# Patient Record
Sex: Female | Born: 1985 | Race: Black or African American | Hispanic: No | Marital: Single | Smoking: Never smoker
Health system: Southern US, Community
[De-identification: ages and names within clinical notes are randomized; demographics above are authoritative.]

## PROBLEM LIST (undated history)

## (undated) DIAGNOSIS — O139 Gestational [pregnancy-induced] hypertension without significant proteinuria, unspecified trimester: Secondary | ICD-10-CM

## (undated) DIAGNOSIS — D649 Anemia, unspecified: Secondary | ICD-10-CM

## (undated) DIAGNOSIS — Z862 Personal history of diseases of the blood and blood-forming organs and certain disorders involving the immune mechanism: Secondary | ICD-10-CM

## (undated) HISTORY — DX: Personal history of diseases of the blood and blood-forming organs and certain disorders involving the immune mechanism: Z86.2

## (undated) HISTORY — PX: FOOT SURGERY: SHX648

## (undated) HISTORY — DX: Gestational (pregnancy-induced) hypertension without significant proteinuria, unspecified trimester: O13.9

---

## 2009-07-30 ENCOUNTER — Emergency Department (HOSPITAL_COMMUNITY): Admission: EM | Admit: 2009-07-30 | Discharge: 2009-07-30 | Payer: Self-pay | Admitting: Emergency Medicine

## 2010-09-18 ENCOUNTER — Emergency Department (HOSPITAL_COMMUNITY): Admission: EM | Admit: 2010-09-18 | Discharge: 2010-09-18 | Payer: Self-pay | Admitting: Emergency Medicine

## 2011-01-02 ENCOUNTER — Inpatient Hospital Stay (HOSPITAL_COMMUNITY)
Admission: AD | Admit: 2011-01-02 | Discharge: 2011-01-02 | Disposition: A | Discharge status: Home / Self Care | Payer: Medicaid Other | Source: Ambulatory Visit | Attending: Obstetrics & Gynecology | Admitting: Obstetrics & Gynecology

## 2011-01-02 DIAGNOSIS — M549 Dorsalgia, unspecified: Secondary | ICD-10-CM | POA: Insufficient documentation

## 2011-01-02 DIAGNOSIS — N390 Urinary tract infection, site not specified: Secondary | ICD-10-CM | POA: Insufficient documentation

## 2011-01-02 DIAGNOSIS — O2390 Unspecified genitourinary tract infection in pregnancy, unspecified trimester: Secondary | ICD-10-CM | POA: Insufficient documentation

## 2011-01-02 LAB — URINALYSIS, ROUTINE W REFLEX MICROSCOPIC
Hgb urine dipstick: NEGATIVE
Protein, ur: NEGATIVE mg/dL
Urine Glucose, Fasting: NEGATIVE mg/dL

## 2011-01-02 LAB — URINE MICROSCOPIC-ADD ON

## 2011-01-04 LAB — URINE CULTURE: Culture  Setup Time: 201202290133

## 2011-01-16 LAB — URINALYSIS, ROUTINE W REFLEX MICROSCOPIC
Glucose, UA: NEGATIVE mg/dL
Ketones, ur: NEGATIVE mg/dL
Specific Gravity, Urine: 1.027 (ref 1.005–1.030)
pH: 6 (ref 5.0–8.0)

## 2011-01-16 LAB — URINE MICROSCOPIC-ADD ON

## 2011-01-16 LAB — POCT I-STAT, CHEM 8
BUN: 8 mg/dL (ref 6–23)
Calcium, Ion: 1.14 mmol/L (ref 1.12–1.32)
Creatinine, Ser: 0.9 mg/dL (ref 0.4–1.2)
Glucose, Bld: 84 mg/dL (ref 70–99)
Hemoglobin: 11.6 g/dL — ABNORMAL LOW (ref 12.0–15.0)
TCO2: 24 mmol/L (ref 0–100)

## 2011-06-13 ENCOUNTER — Emergency Department (HOSPITAL_COMMUNITY): Payer: Medicaid Other

## 2011-06-13 ENCOUNTER — Emergency Department (HOSPITAL_COMMUNITY)
Admission: EM | Admit: 2011-06-13 | Discharge: 2011-06-13 | Disposition: A | Discharge status: Home / Self Care | Payer: Medicaid Other | Attending: Emergency Medicine | Admitting: Emergency Medicine

## 2011-06-13 DIAGNOSIS — M25569 Pain in unspecified knee: Secondary | ICD-10-CM | POA: Insufficient documentation

## 2011-06-13 DIAGNOSIS — Y92410 Unspecified street and highway as the place of occurrence of the external cause: Secondary | ICD-10-CM | POA: Insufficient documentation

## 2011-06-13 DIAGNOSIS — W010XXA Fall on same level from slipping, tripping and stumbling without subsequent striking against object, initial encounter: Secondary | ICD-10-CM | POA: Insufficient documentation

## 2011-06-13 DIAGNOSIS — S8010XA Contusion of unspecified lower leg, initial encounter: Secondary | ICD-10-CM | POA: Insufficient documentation

## 2013-01-19 ENCOUNTER — Encounter (HOSPITAL_COMMUNITY): Payer: Self-pay | Admitting: Emergency Medicine

## 2013-01-19 ENCOUNTER — Emergency Department (HOSPITAL_COMMUNITY)
Admission: EM | Admit: 2013-01-19 | Discharge: 2013-01-19 | Disposition: A | Discharge status: Home / Self Care | Payer: No Typology Code available for payment source | Attending: Emergency Medicine | Admitting: Emergency Medicine

## 2013-01-19 DIAGNOSIS — T148XXA Other injury of unspecified body region, initial encounter: Secondary | ICD-10-CM | POA: Insufficient documentation

## 2013-01-19 DIAGNOSIS — S46909A Unspecified injury of unspecified muscle, fascia and tendon at shoulder and upper arm level, unspecified arm, initial encounter: Secondary | ICD-10-CM | POA: Insufficient documentation

## 2013-01-19 DIAGNOSIS — Y9389 Activity, other specified: Secondary | ICD-10-CM | POA: Insufficient documentation

## 2013-01-19 DIAGNOSIS — Y92410 Unspecified street and highway as the place of occurrence of the external cause: Secondary | ICD-10-CM | POA: Insufficient documentation

## 2013-01-19 DIAGNOSIS — S4980XA Other specified injuries of shoulder and upper arm, unspecified arm, initial encounter: Secondary | ICD-10-CM | POA: Insufficient documentation

## 2013-01-19 DIAGNOSIS — IMO0002 Reserved for concepts with insufficient information to code with codable children: Secondary | ICD-10-CM | POA: Insufficient documentation

## 2013-01-19 DIAGNOSIS — G44309 Post-traumatic headache, unspecified, not intractable: Secondary | ICD-10-CM | POA: Insufficient documentation

## 2013-01-19 MED ORDER — IBUPROFEN 800 MG PO TABS: 800.0000 mg | ORAL_TABLET | Freq: Three times a day (TID) | ORAL | Status: DC

## 2013-01-19 MED ORDER — IBUPROFEN 800 MG PO TABS
800.0000 mg | ORAL_TABLET | Freq: Once | ORAL | Status: AC
  Administered 2013-01-19: 800 mg via ORAL
  Filled 2013-01-19: qty 1

## 2013-01-19 MED ORDER — HYDROCODONE-ACETAMINOPHEN 5-325 MG PO TABS: 1.0000 | ORAL_TABLET | ORAL | Status: DC | PRN

## 2013-01-19 MED ORDER — CYCLOBENZAPRINE HCL 10 MG PO TABS: 10.0000 mg | ORAL_TABLET | Freq: Two times a day (BID) | ORAL | Status: DC | PRN
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
## 2013-01-22 NOTE — ED Provider Notes (Signed)
Medical screening examination/treatment/procedure(s) were performed by non-physician practitioner and as supervising physician I was immediately available for consultation/collaboration.  Gilda Crease, MD 01/22/13 1258

## 2013-08-04 ENCOUNTER — Encounter (HOSPITAL_COMMUNITY): Payer: Self-pay

## 2013-08-04 ENCOUNTER — Emergency Department (HOSPITAL_COMMUNITY)
Admission: EM | Admit: 2013-08-04 | Discharge: 2013-08-05 | Disposition: A | Discharge status: Home / Self Care | Payer: No Typology Code available for payment source | Attending: Emergency Medicine | Admitting: Emergency Medicine

## 2013-08-04 DIAGNOSIS — W010XXA Fall on same level from slipping, tripping and stumbling without subsequent striking against object, initial encounter: Secondary | ICD-10-CM | POA: Insufficient documentation

## 2013-08-04 DIAGNOSIS — Y9289 Other specified places as the place of occurrence of the external cause: Secondary | ICD-10-CM | POA: Insufficient documentation

## 2013-08-04 DIAGNOSIS — Y9302 Activity, running: Secondary | ICD-10-CM | POA: Insufficient documentation

## 2013-08-04 DIAGNOSIS — S8991XA Unspecified injury of right lower leg, initial encounter: Secondary | ICD-10-CM

## 2013-08-04 DIAGNOSIS — S8990XA Unspecified injury of unspecified lower leg, initial encounter: Secondary | ICD-10-CM | POA: Insufficient documentation
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
## 2013-08-05 ENCOUNTER — Emergency Department (HOSPITAL_COMMUNITY): Payer: Medicaid Other

## 2013-08-05 MED ORDER — IBUPROFEN 800 MG PO TABS
ORAL_TABLET | ORAL | Status: AC
  Administered 2013-08-05: 800 mg via ORAL
  Filled 2013-08-05: qty 1

## 2013-08-05 MED ORDER — IBUPROFEN 800 MG PO TABS
800.0000 mg | ORAL_TABLET | Freq: Once | ORAL | Status: AC
  Administered 2013-08-05: 800 mg via ORAL

## 2013-08-05 MED ORDER — HYDROCODONE-ACETAMINOPHEN 5-325 MG PO TABS: 1.0000 | ORAL_TABLET | Freq: Four times a day (QID) | ORAL | Status: DC | PRN

## 2013-08-05 NOTE — ED Provider Notes (Signed)
Medical screening examination/treatment/procedure(s) were performed by non-physician practitioner and as supervising physician I was immediately available for consultation/collaboration.  Akshaj Besancon, MD 08/05/13 0622 
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
## 2017-08-04 ENCOUNTER — Emergency Department (HOSPITAL_COMMUNITY)
Admission: EM | Admit: 2017-08-04 | Discharge: 2017-08-04 | Disposition: A | Discharge status: Home / Self Care | Payer: Self-pay | Attending: Emergency Medicine | Admitting: Emergency Medicine

## 2017-08-04 ENCOUNTER — Emergency Department (HOSPITAL_COMMUNITY): Payer: Self-pay

## 2017-08-04 ENCOUNTER — Encounter (HOSPITAL_COMMUNITY): Payer: Self-pay

## 2017-08-04 DIAGNOSIS — S161XXA Strain of muscle, fascia and tendon at neck level, initial encounter: Secondary | ICD-10-CM | POA: Insufficient documentation

## 2017-08-04 DIAGNOSIS — Y999 Unspecified external cause status: Secondary | ICD-10-CM | POA: Insufficient documentation

## 2017-08-04 DIAGNOSIS — S20212A Contusion of left front wall of thorax, initial encounter: Secondary | ICD-10-CM | POA: Insufficient documentation

## 2017-08-04 DIAGNOSIS — Y939 Activity, unspecified: Secondary | ICD-10-CM | POA: Insufficient documentation

## 2017-08-04 DIAGNOSIS — Y929 Unspecified place or not applicable: Secondary | ICD-10-CM | POA: Insufficient documentation

## 2017-08-04 MED ORDER — CYCLOBENZAPRINE HCL 10 MG PO TABS
10.0000 mg | ORAL_TABLET | Freq: Once | ORAL | Status: AC
  Administered 2017-08-04: 10 mg via ORAL
  Filled 2017-08-04: qty 1

## 2017-08-04 MED ORDER — DICLOFENAC SODIUM 50 MG PO TBEC: 50.0000 mg | DELAYED_RELEASE_TABLET | Freq: Two times a day (BID) | ORAL | 0 refills | Status: DC

## 2017-08-04 MED ORDER — CYCLOBENZAPRINE HCL 10 MG PO TABS: 10.0000 mg | ORAL_TABLET | Freq: Two times a day (BID) | ORAL | 0 refills | Status: DC | PRN

## 2017-08-04 MED ORDER — HYDROCODONE-ACETAMINOPHEN 5-325 MG PO TABS
1.0000 | ORAL_TABLET | Freq: Once | ORAL | Status: AC
  Administered 2017-08-04: 1 via ORAL
  Filled 2017-08-04: qty 1
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
## 2017-08-04 NOTE — Discharge Instructions (Signed)
Do not drive while taking the muscle relaxer as it will make you sleepy. Follow up with your doctor. Return here for worsening symptoms
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
## 2018-12-25 ENCOUNTER — Encounter (HOSPITAL_COMMUNITY): Payer: Self-pay

## 2018-12-25 ENCOUNTER — Emergency Department (HOSPITAL_COMMUNITY)
Admission: EM | Admit: 2018-12-25 | Discharge: 2018-12-25 | Disposition: A | Discharge status: Home / Self Care | Payer: Self-pay | Attending: Emergency Medicine | Admitting: Emergency Medicine

## 2018-12-25 ENCOUNTER — Other Ambulatory Visit: Payer: Self-pay

## 2018-12-25 DIAGNOSIS — Z79899 Other long term (current) drug therapy: Secondary | ICD-10-CM | POA: Insufficient documentation

## 2018-12-25 DIAGNOSIS — K0889 Other specified disorders of teeth and supporting structures: Secondary | ICD-10-CM | POA: Insufficient documentation

## 2018-12-25 MED ORDER — PENICILLIN V POTASSIUM 500 MG PO TABS: 500.0000 mg | ORAL_TABLET | Freq: Four times a day (QID) | ORAL | 0 refills | Status: AC

## 2018-12-25 MED FILL — PENICILLIN VK 500 MG TABLET: 500 | 10 days supply | Qty: 40 | Fill #0

## 2018-12-25 NOTE — ED Triage Notes (Signed)
Pt endorses left upper dental pain after having the back upper left tooth chip while eating "a while ago" No pain until 2 days ago. VSS

## 2018-12-25 NOTE — Discharge Instructions (Signed)
Please read attached information. If you experience any new or worsening signs or symptoms please return to the emergency room for evaluation. Please follow-up with your primary care provider or specialist as discussed. Please use medication prescribed only as directed and discontinue taking if you have any concerning signs or symptoms.   °

## 2018-12-25 NOTE — ED Provider Notes (Signed)
[facility] EMERGENCY DEPARTMENT Provider Note   CSN: [id number] Arrival date & time: 12/25/18  3762   History   Chief Complaint Chief Complaint  Patient presents with  . Dental Pain    HPI Lydia Mendoza is a 33 y.o. female.    HPI    33 year old female presents today with complaints of dental pain.  Patient notes ongoing history of worsening left upper third molar decay.  She notes pieces have been chipping away.  She notes 2 days ago she developed pain to this area with some bleeding from the surrounding gums.  She denies any significant swelling, fever, patient swelling.  Patient does not have a dentist.  No medications have been used for pain.   History reviewed. No pertinent past medical history.  There are no active problems to display for this patient.   Past Surgical History:  Procedure Laterality Date  . FOOT SURGERY       OB History   No obstetric history on file.      Home Medications    Prior to Admission medications   Medication Sig Start Date End Date Taking? Authorizing Provider  cyclobenzaprine (FLEXERIL) 10 MG tablet Take 1 tablet (10 mg total) by mouth 2 (two) times daily as needed for muscle spasms. 08/04/17   Ashley Murrain, NP  diclofenac (VOLTAREN) 50 MG EC tablet Take 1 tablet (50 mg total) by mouth 2 (two) times daily. 08/04/17   Ashley Murrain, NP  HYDROcodone-acetaminophen (NORCO/VICODIN) 5-325 MG per tablet Take 1 tablet by mouth every 6 (six) hours as needed for pain. 08/05/13   Lawyer, Harrell Gave, PA-C  penicillin v potassium (VEETID) 500 MG tablet Take 1 tablet (500 mg total) by mouth 4 (four) times daily for 10 days. 12/25/18 01/04/19  Okey Regal, PA-C    Family History History reviewed. No pertinent family history.  Social History Social History   Tobacco Use  . Smoking status: Never Smoker  Substance Use Topics  . Alcohol use: No  . Drug use: No     Allergies   Patient has no known allergies.   Review  of Systems Review of Systems  All other systems reviewed and are negative.   Physical Exam Updated Vital Signs BP 134/85 (BP Location: Right Arm)   Pulse 70   Temp 98 F (36.7 C) (Oral)   Resp 18   LMP 11/27/2018 (Exact Date)   SpO2 99%   Physical Exam Vitals signs and nursing note reviewed.  Constitutional:      Appearance: She is well-developed.  HENT:     Head: Normocephalic and atraumatic.     Comments: Left third molar with decay down to the gumline, no surrounding erythema bleeding or exudate-gumline palpated with no swelling or masses Eyes:     General: No scleral icterus.       Right eye: No discharge.        Left eye: No discharge.     Conjunctiva/sclera: Conjunctivae normal.     Pupils: Pupils are equal, round, and reactive to light.  Neck:     Musculoskeletal: Normal range of motion.     Vascular: No JVD.     Trachea: No tracheal deviation.  Pulmonary:     Effort: Pulmonary effort is normal.     Breath sounds: No stridor.  Neurological:     Mental Status: She is alert and oriented to person, place, and time.     Coordination: Coordination normal.  Psychiatric:  Behavior: Behavior normal.        Thought Content: Thought content normal.        Judgment: Judgment normal.      ED Treatments / Results  Labs (all labs ordered are listed, but only abnormal results are displayed) Labs Reviewed - No data to display  EKG None  Radiology No results found.  Procedures Procedures (including critical care time)  Medications Ordered in ED Medications - No data to display   Initial Impression / Assessment and Plan / ED Course  I have reviewed the triage vital signs and the nursing notes.  Pertinent labs & imaging results that were available during my care of the patient were reviewed by me and considered in my medical decision making (see chart for details).        33 year old female presents today with dental pain.  She has no signs of  drainable abscess.  She notes some bleeding, question early infection.  Patient be placed on antibiotics encouraged use Tylenol ibuprofen discharged with outpatient follow-up and return precautions.  She verbalized understanding and agreement to today's plan.  Final Clinical Impressions(s) / ED Diagnoses   Final diagnoses:  Pain, dental    ED Discharge Orders         Ordered    penicillin v potassium (VEETID) 500 MG tablet  4 times daily     12/25/18 1155           Okey Regal, Hershal Coria 12/25/18 1618    Jola Schmidt, MD 12/25/18 [phone]

## 2019-02-04 ENCOUNTER — Ambulatory Visit: Payer: Self-pay | Admitting: Family Medicine

## 2019-02-25 ENCOUNTER — Ambulatory Visit: Payer: Self-pay | Admitting: Family Medicine

## 2019-03-11 ENCOUNTER — Ambulatory Visit: Payer: Self-pay | Attending: Family Medicine | Admitting: Family Medicine

## 2019-03-11 ENCOUNTER — Other Ambulatory Visit: Payer: Self-pay

## 2019-03-11 ENCOUNTER — Encounter: Payer: Self-pay | Admitting: Family Medicine

## 2019-03-11 DIAGNOSIS — J301 Allergic rhinitis due to pollen: Secondary | ICD-10-CM | POA: Insufficient documentation

## 2019-03-11 MED ORDER — CETIRIZINE HCL 10 MG PO TABS: 10.0000 mg | ORAL_TABLET | Freq: Every day | ORAL | 3 refills | Status: DC

## 2019-03-11 NOTE — Progress Notes (Signed)
Per pt this visit is to est care and that's it. Perpt she is feeling fine and do not need refills.

## 2019-03-11 NOTE — Progress Notes (Signed)
Virtual Visit via Telephone Note  I connected with De Nurse on 03/11/19 at  3:30 PM EDT by telephone and verified that I am speaking with the correct person using two identifiers.   I discussed the limitations, risks, security and privacy concerns of performing an evaluation and management service by telephone and the availability of in person appointments. I also discussed with the patient that there may be a patient responsible charge related to this service. The patient expressed understanding and agreed to proceed.  Patient Location: Home Provider Location: Office Others participating in call: Emilio Aspen, RMA   History of Present Illness:      33 year old female new to the practice.  Patient is status post emergency department visit on December 25, 2018 secondary to dental pain.  Patient reports that since that time she has been able to follow-up with a dentist and is now having no issues with dental pain.  Patient reports that she currently takes Zyrtec for sneezing, nasal congestion related to seasonal allergies which are triggered by pollen.  Patient generally needs to take medication from spring through the end of the summer.  Patient states that she is otherwise healthy.  She has a past history of anemia during pregnancy.  She has had no recent follow-up of anemia but denies any issues with fatigue.  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  Patient has family history significant for maternal grandmother with breast cancer and mother with hypertension and depression.  No family history of heart disease or diabetes.  Patient has never smoked.  Patient reports that she was working for [employer] in [employer] but was recently laid off due to the COVID-19 pandemic.  Patient has no significant concerns or issues but wanted to  establish care.  Patient would like to schedule for a well exam later in the year.  Past Medical History:  Diagnosis Date  . History of anemia     Past Surgical History:  Procedure Laterality Date  . FOOT SURGERY Left    age 53 due to congenital deformity    Family History  Problem Relation Age of Onset  . Depression Mother   . Breast cancer Maternal Grandmother   . Hypertension Maternal Grandmother   . Heart disease Neg Hx     Social History   Tobacco Use  . Smoking status: Never Smoker  . Smokeless tobacco: Never Used  Substance Use Topics  . Alcohol use: No  . Drug use: No     No Known Allergies   Review of Systems  Constitutional: Negative for chills, fever and malaise/fatigue.  HENT: Positive for congestion. Negative for sore throat.        Sneezing  Eyes: Negative for blurred vision and double vision.  Respiratory: Negative for cough and shortness of breath.   Cardiovascular: Negative for chest pain and palpitations.  Gastrointestinal: Negative for abdominal pain, constipation, diarrhea, nausea and vomiting.  Genitourinary: Negative for dysuria and frequency.  Musculoskeletal: Negative for back pain, joint pain and myalgias.  Skin: Negative for itching and rash.  Neurological: Negative for dizziness and headaches.  Endo/Heme/Allergies: Negative for polydipsia. Does not bruise/bleed easily.     Observations/Objective: No vital signs or physical exam conducted as visit was done via telephone due to current restrictions/limitations on-office visits secondary to recurrent COVID-19 pandemic  Assessment and Plan: 1. Seasonal allergic rhinitis due to pollen Patient with seasonal allergic rhinitis due to pollen.  She currently takes over-the-counter Zyrtec.  Prescription is sent to the pharmacy at this office which may be cheaper for the patient.  Patient is otherwise well and will schedule preventive/well female exam later in the year.  - cetirizine (ZYRTEC) 10 MG  tablet; Take 1 tablet (10 mg total) by mouth at bedtime.  Dispense: 90 tablet; Refill: 3 - Ambulatory referral to Social Work  Follow Up Instructions:Return if symptoms worsen or fail to improve, for well female exam in summer or fall.    I discussed the assessment and treatment plan with the patient. The patient was provided an opportunity to ask questions and all were answered. The patient agreed with the plan and demonstrated an understanding of the instructions.   The patient was advised to call back or seek an in-person evaluation if the symptoms worsen or if the condition fails to improve as anticipated.  I provided 8 minutes of non-face-to-face time during this encounter.   Antony Blackbird, MD

## 2019-05-18 ENCOUNTER — Telehealth: Payer: Self-pay | Admitting: Licensed Clinical Social Worker

## 2019-05-18 NOTE — Telephone Encounter (Signed)
Call placed to patient. LCSW introduced self and explained role at [facility]. Pt shared that she is currently uninsured.   LCSW informed pt of the role of Financial Counselors at [facility] and the application process. Pt agreed to allow LCSW to mail application to address on file. No additional concerns noted.

## 2019-11-06 NOTE — L&D Delivery Note (Signed)
Delivery Note   Patient Name: Lydia Mendoza DOB: [birth year] MRN: [id number]  Date of admission: 10/21/2020 Delivering MD: Noralyn Pick  Date of delivery: 10/22/20 Type of delivery: SVD  Newborn Data: Live born female  Birth Weight:   APGAR: 44, 35   Newborn Delivery   Birth date/time: 10/22/2020 20:48:00 Delivery type: Vaginal, Spontaneous     De Nurse, 34 y.o., @ [redacted]w[redacted]d,  [id number], who was admitted for presenting on 12/17 @ 0000 for IOL for preeclampsia w/o SF.Denies HA, Visual disturbances or epigastric pain.No meds for PreE and BP 148/93.  I was called to the room when she progressed 7cm and have decel, HR to nadir of 80s, remained in 80-90s for 5 mins, FSE placed after reviewing R/V/A, bolus had been given, pitocin turned off, terbutaline given, rapid decent and dilation noted, pt become complete with HR in 60s, urge to push noted, Dr Landry Mellow called and notified, DR Elonda Husky in room assessing. Pt  Then was encouraged to push at 1+ station in the second stage of labor.  She pushed for 1.5/min.  She delivered a viable infant, cephalic and restituted to the ROA position over an intact perineum.  A double nuchal cord   was identified. The baby was placed on maternal abdomen while initial step of NRP were perfmored (Dry, Stimulated, and warmed). Hat placed on baby for thermoregulation. Delayed cord clamping was performed for 1.5 minutes.  Cord double clamped and cut.  Cord cut by fasther. Apgar scores were 8 and 9. Prophylactic Pitocin was started in the third stage of labor for active management. The placenta delivered spontaneously, shultz, with a 3 vessel cord and was sent to LD.  Inspection revealed none. An examination of the vaginal vault and cervix was free from lacerations. The uterus was firm, bleeding stable.   Umbilical venous blood gas was sent.  There were no complications during the procedure.  Mom and baby skin to skin following delivery. Left in stable condition.  Venous blood gad  drawn: Results Pending    Maternal Info: Anesthesia: None Episiotomy: No Lacerations:  No Suture Repair: No Est. Blood Loss (mL):  511 mls  Newborn Info:  Baby Sex: female Circumcision: in pt desired  APGAR (1 MIN): 8   APGAR (5 MINS): 9   APGAR (10 MINS):     Mom to postpartum.  Baby to Couplet care / Skin to Skin.  DR Landry Mellow updated   [place], [place], NP-C 10/22/20 9:19 PM

## 2020-03-16 ENCOUNTER — Encounter: Payer: Self-pay | Admitting: Internal Medicine

## 2020-03-16 ENCOUNTER — Telehealth (INDEPENDENT_AMBULATORY_CARE_PROVIDER_SITE_OTHER): Payer: Medicaid Other | Admitting: Internal Medicine

## 2020-03-16 ENCOUNTER — Other Ambulatory Visit: Payer: Self-pay

## 2020-03-16 DIAGNOSIS — Z3A01 Less than 8 weeks gestation of pregnancy: Secondary | ICD-10-CM | POA: Diagnosis not present

## 2020-03-16 DIAGNOSIS — Z7689 Persons encountering health services in other specified circumstances: Secondary | ICD-10-CM

## 2020-03-16 DIAGNOSIS — L910 Hypertrophic scar: Secondary | ICD-10-CM

## 2020-03-16 MED ORDER — PRENATAL 28-0.8 MG PO TABS: 1.0000 | ORAL_TABLET | Freq: Every day | ORAL | 11 refills | Status: DC

## 2020-03-16 NOTE — Progress Notes (Signed)
Virtual Visit via Telephone Note  I connected with Lydia Mendoza, on 03/16/2020 at 10:04 AM by telephone due to the COVID-19 pandemic and verified that I am speaking with the correct person using two identifiers.   Consent: I discussed the limitations, risks, security and privacy concerns of performing an evaluation and management service by telephone and the availability of in person appointments. I also discussed with the patient that there may be a patient responsible charge related to this service. The patient expressed understanding and agreed to proceed.   Location of Patient: Home   Location of Provider: Clinic    Persons participating in Telemedicine visit: [facility] Dr. Juleen China      History of Present Illness: Patient has a visit to establish care. No PMH. Never taken any type of medications. She had surgery on her left foot as a young child for reconstructive surgery for birth defect.   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 Have been present over 10 years. The one behind her ear and on her arm have become painful. One behind her ear is getting larger.   Patient reports she took HPT and it was positive. Her LMP was 4/2. Has history of twin gestation. Three living children. History of one TAB. No history of GDM. GHTN, or Pre-E. She is not currently taking a prenatal vitamin. She has set up care with Ob provider.    Past Medical History:  Diagnosis Date  . History of anemia    Allergies  Allergen Reactions  . Shellfish Allergy Hives and Itching    No current outpatient medications on file prior to visit.   No current facility-administered medications on file prior to visit.    Observations/Objective: NAD. Speaking clearly.  Work of breathing normal.  Alert and oriented. Mood appropriate.   Assessment and Plan: 1. Encounter to establish care Reviewed patient's PMH, social  history, surgical history, and medications.  Is overdue for annual exam, screening blood work, and health maintenance topics. Have asked patient to ensure OB does PAP.   2. Keloid - Ambulatory referral to Dermatology  3. [redacted] weeks gestation of pregnancy Already has visit for initial prenatal care. Discussed use of MAU during pregnancy and reasons to seek emergency care during her pregnancy. PNV sent to her pharmacy. She is not currently taking any other medications.  - Prenatal 28-0.8 MG TABS; Take 1 tablet by mouth daily.  Dispense: 30 tablet; Refill: 11    Follow Up Instructions: OB to establish care    I discussed the assessment and treatment plan with the patient. The patient was provided an opportunity to ask questions and all were answered. The patient agreed with the plan and demonstrated an understanding of the instructions.   The patient was advised to call back or seek an in-person evaluation if the symptoms worsen or if the condition fails to improve as anticipated.     I provided 16 minutes total of non-face-to-face time during this encounter including median intraservice time, reviewing previous notes, investigations, ordering medications, medical decision making, coordinating care and patient verbalized understanding at the end of the visit.    Phill Myron, D.O. Primary Care at [facility]  03/16/2020, 10:04 AM

## 2020-03-21 ENCOUNTER — Telehealth: Payer: Self-pay | Admitting: General Practice

## 2020-03-21 MED ORDER — PRENATAL VITAMIN 27-0.8 MG PO TABS: 1.0000 | ORAL_TABLET | Freq: Every day | ORAL | 2 refills | Status: AC

## 2020-03-21 NOTE — Telephone Encounter (Signed)
[place] (SE), [place] - [address]  [phone] [address], [place] (SE) [facility] doesn't have this brand need a new medication Prenatal 28-0.8 MG TABS [id number]

## 2020-03-21 NOTE — Telephone Encounter (Signed)
Alternative Rx sent

## 2020-04-25 LAB — OB RESULTS CONSOLE ABO/RH: RH Type: POSITIVE

## 2020-04-25 LAB — OB RESULTS CONSOLE HEPATITIS B SURFACE ANTIGEN: Hepatitis B Surface Ag: NEGATIVE

## 2020-04-25 LAB — OB RESULTS CONSOLE RUBELLA ANTIBODY, IGM: Rubella: IMMUNE

## 2020-04-25 LAB — OB RESULTS CONSOLE HIV ANTIBODY (ROUTINE TESTING): HIV: NONREACTIVE

## 2020-04-28 LAB — OB RESULTS CONSOLE GC/CHLAMYDIA
Chlamydia: NEGATIVE
Gonorrhea: NEGATIVE

## 2020-06-16 ENCOUNTER — Other Ambulatory Visit: Payer: Self-pay

## 2020-06-16 ENCOUNTER — Emergency Department (HOSPITAL_COMMUNITY)
Admission: EM | Admit: 2020-06-16 | Discharge: 2020-06-16 | Disposition: A | Discharge status: Home / Self Care | Payer: Medicaid Other | Attending: Emergency Medicine | Admitting: Emergency Medicine

## 2020-06-16 ENCOUNTER — Encounter (HOSPITAL_COMMUNITY): Payer: Self-pay | Admitting: Obstetrics and Gynecology

## 2020-06-16 DIAGNOSIS — Z20822 Contact with and (suspected) exposure to covid-19: Secondary | ICD-10-CM | POA: Diagnosis present

## 2020-06-16 DIAGNOSIS — O26892 Other specified pregnancy related conditions, second trimester: Secondary | ICD-10-CM | POA: Insufficient documentation

## 2020-06-16 DIAGNOSIS — Z3A20 20 weeks gestation of pregnancy: Secondary | ICD-10-CM | POA: Insufficient documentation

## 2020-06-16 DIAGNOSIS — M545 Low back pain: Secondary | ICD-10-CM | POA: Insufficient documentation

## 2020-06-16 LAB — SARS CORONAVIRUS 2 BY RT PCR (HOSPITAL ORDER, PERFORMED IN ~~LOC~~ HOSPITAL LAB): SARS Coronavirus 2: NEGATIVE

## 2020-06-16 NOTE — ED Provider Notes (Signed)
[facility] DEPT Provider Note   CSN: [id number] Arrival date & time: 06/16/20  1858     History Chief Complaint  Patient presents with  . Covid Exposure    Lydia Mendoza is a 34 y.o. female.  Patient is a 34 year old female who presents after Covid exposure.  She is 20 months pregnant.  She is fully vaccinated with her last vaccination in June for Covid.  Her husband recently tested positive for Covid within the last 2 days.  She does not currently have any symptoms.  No fevers.  No cough or cold symptoms.  She does not have any pregnancy related complaints.  She has some low back pain which she says feels like it is in her muscles.  It is worse when she moves around.  Its been going on for about 2 weeks.  No abdominal pain.  No cramping.  No vaginal bleeding or discharge.  No urinary symptoms.  She is here for the Covid exposure with the rest of her family.  She is currently undergoing normal prenatal care and has an OB/GYN here in [place].        Past Medical History:  Diagnosis Date  . History of anemia     Patient Active Problem List   Diagnosis Date Noted  . Seasonal allergic rhinitis due to pollen 03/11/2019    Past Surgical History:  Procedure Laterality Date  . FOOT SURGERY Left    age 14 due to congenital deformity     OB History    Gravida  1   Para      Term      Preterm      AB      Living        SAB      TAB      Ectopic      Multiple      Live Births              Family History  Problem Relation Age of Onset  . Depression Mother   . Breast cancer Maternal Grandmother   . Hypertension Maternal Grandmother   . Heart disease Neg Hx     Social History   Tobacco Use  . Smoking status: Never Smoker  . Smokeless tobacco: Never Used  Vaping Use  . Vaping Use: Never used  Substance Use Topics  . Alcohol use: No  . Drug use: No    Home Medications Prior to Admission medications     Medication Sig Start Date End Date Taking? Authorizing Provider  Prenatal Vit-Fe Fumarate-FA (PRENATAL VITAMIN) 27-0.8 MG TABS Take 1 tablet by mouth daily. 03/21/20   Nicolette Bang, DO    Allergies    Shellfish allergy  Review of Systems   Review of Systems  Constitutional: Negative for chills, diaphoresis, fatigue and fever.  HENT: Negative for congestion, rhinorrhea and sneezing.   Eyes: Negative.   Respiratory: Negative for cough, chest tightness and shortness of breath.   Cardiovascular: Negative for chest pain and leg swelling.  Gastrointestinal: Negative for abdominal pain, blood in stool, diarrhea, nausea and vomiting.  Genitourinary: Negative for difficulty urinating, flank pain, frequency and hematuria.  Musculoskeletal: Positive for back pain. Negative for arthralgias.  Skin: Negative for rash.  Neurological: Negative for dizziness, speech difficulty, weakness, numbness and headaches.    Physical Exam Updated Vital Signs BP 122/78 (BP Location: Right Arm)   Pulse 94   Temp (!) 97.5 F (36.4 C) (Oral)  Resp 16   LMP 02/05/2020 (Exact Date)   SpO2 99%   Physical Exam Constitutional:      Appearance: She is well-developed.  HENT:     Head: Normocephalic and atraumatic.  Eyes:     Pupils: Pupils are equal, round, and reactive to light.  Cardiovascular:     Rate and Rhythm: Normal rate and regular rhythm.     Heart sounds: Normal heart sounds.  Pulmonary:     Effort: Pulmonary effort is normal. No respiratory distress.     Breath sounds: Normal breath sounds. No wheezing or rales.  Chest:     Chest wall: No tenderness.  Abdominal:     General: Bowel sounds are normal.     Palpations: Abdomen is soft.     Tenderness: There is no abdominal tenderness. There is no guarding or rebound.     Comments: Gravid uterus  Musculoskeletal:        General: Normal range of motion.     Cervical back: Normal range of motion and neck supple.  Lymphadenopathy:      Cervical: No cervical adenopathy.  Skin:    General: Skin is warm and dry.     Findings: No rash.  Neurological:     Mental Status: She is alert and oriented to person, place, and time.     ED Results / Procedures / Treatments   Labs (all labs ordered are listed, but only abnormal results are displayed) Labs Reviewed  SARS CORONAVIRUS 2 BY RT PCR (HOSPITAL ORDER, [place] [facility])    EKG None  Radiology No results found.  Procedures Procedures (including critical care time)  Medications Ordered in ED Medications - No data to display  ED Course  I have reviewed the triage vital signs and the nursing notes.  Pertinent labs & imaging results that were available during my care of the patient were reviewed by me and considered in my medical decision making (see chart for details).    MDM Rules/Calculators/A&P                          Patient presents after Covid exposure.  She is fully vaccinated.  There recently was information about a prophylaxis dose for patients with Covid exposures who are high risk for complications of Covid.  I spoke with Dr. Linus Salmons who advised that this has not yet been started at our facility.  Patient was given symptomatic care instructions and return precautions.  She was also given quarantine instructions. Final Clinical Impression(s) / ED Diagnoses Final diagnoses:  Close exposure to COVID-19 virus    Rx / DC Orders ED Discharge Orders    None       Malvin Johns, MD 06/16/20 2153

## 2020-06-16 NOTE — ED Notes (Signed)
Fetal heart tones 156

## 2020-06-16 NOTE — ED Triage Notes (Signed)
Patient reports to the ER for COVID exposure and back pain. Patient reports she is 5 months pregnant, pt denies bleeding or cramping.

## 2020-09-28 ENCOUNTER — Inpatient Hospital Stay (HOSPITAL_COMMUNITY)
Admission: AD | Admit: 2020-09-28 | Discharge: 2020-09-28 | Disposition: A | Discharge status: Home / Self Care | Payer: Medicaid Other | Attending: Obstetrics & Gynecology | Admitting: Obstetrics & Gynecology

## 2020-09-28 ENCOUNTER — Other Ambulatory Visit: Payer: Self-pay

## 2020-09-28 ENCOUNTER — Encounter (HOSPITAL_COMMUNITY): Payer: Self-pay | Admitting: Obstetrics & Gynecology

## 2020-09-28 DIAGNOSIS — O99891 Other specified diseases and conditions complicating pregnancy: Secondary | ICD-10-CM | POA: Diagnosis not present

## 2020-09-28 DIAGNOSIS — R030 Elevated blood-pressure reading, without diagnosis of hypertension: Secondary | ICD-10-CM

## 2020-09-28 DIAGNOSIS — Z7982 Long term (current) use of aspirin: Secondary | ICD-10-CM | POA: Insufficient documentation

## 2020-09-28 DIAGNOSIS — Z3689 Encounter for other specified antenatal screening: Secondary | ICD-10-CM

## 2020-09-28 DIAGNOSIS — R1030 Lower abdominal pain, unspecified: Secondary | ICD-10-CM | POA: Diagnosis present

## 2020-09-28 DIAGNOSIS — O26893 Other specified pregnancy related conditions, third trimester: Secondary | ICD-10-CM | POA: Insufficient documentation

## 2020-09-28 DIAGNOSIS — O163 Unspecified maternal hypertension, third trimester: Secondary | ICD-10-CM

## 2020-09-28 DIAGNOSIS — Z3A33 33 weeks gestation of pregnancy: Secondary | ICD-10-CM | POA: Diagnosis not present

## 2020-09-28 HISTORY — DX: Anemia, unspecified: D64.9

## 2020-09-28 LAB — COMPREHENSIVE METABOLIC PANEL
ALT: 23 U/L (ref 0–44)
AST: 26 U/L (ref 15–41)
Albumin: 2.4 g/dL — ABNORMAL LOW (ref 3.5–5.0)
Alkaline Phosphatase: 108 U/L (ref 38–126)
Anion gap: 13 (ref 5–15)
BUN: 5 mg/dL — ABNORMAL LOW (ref 6–20)
CO2: 22 mmol/L (ref 22–32)
Calcium: 8.7 mg/dL — ABNORMAL LOW (ref 8.9–10.3)
Chloride: 103 mmol/L (ref 98–111)
Creatinine, Ser: 0.69 mg/dL (ref 0.44–1.00)
GFR, Estimated: >60 mL/min (ref >60)
Glucose, Bld: 81 mg/dL (ref 70–99)
Potassium: 3.6 mmol/L (ref 3.5–5.1)
Sodium: 138 mmol/L (ref 135–145)
Total Bilirubin: 0.6 mg/dL (ref 0.3–1.2)
Total Protein: 6.7 g/dL (ref 6.5–8.1)

## 2020-09-28 LAB — CBC
HCT: 32 % — ABNORMAL LOW (ref 36.0–46.0)
Hemoglobin: 10.1 g/dL — ABNORMAL LOW (ref 12.0–15.0)
MCH: 27.6 pg (ref 26.0–34.0)
MCHC: 31.6 g/dL (ref 30.0–36.0)
MCV: 87.4 fL (ref 80.0–100.0)
Platelets: 337 10*3/uL (ref 150–400)
RBC: 3.66 MIL/uL — ABNORMAL LOW (ref 3.87–5.11)
RDW: 13.9 % (ref 11.5–15.5)
WBC: 8.2 10*3/uL (ref 4.0–10.5)
nRBC: 0 % (ref 0.0–0.2)

## 2020-09-28 LAB — PROTEIN / CREATININE RATIO, URINE
Creatinine, Urine: 290.2 mg/dL
Protein Creatinine Ratio: 0.21 mg/mg{Cre} — ABNORMAL HIGH (ref 0.00–0.15)
Total Protein, Urine: 61 mg/dL

## 2020-09-28 LAB — URINALYSIS, ROUTINE W REFLEX MICROSCOPIC
Bilirubin Urine: NEGATIVE
Glucose, UA: NEGATIVE mg/dL
Hgb urine dipstick: NEGATIVE
Ketones, ur: 80 mg/dL — AB
Leukocytes,Ua: NEGATIVE
Nitrite: NEGATIVE
Protein, ur: 100 mg/dL — AB
Specific Gravity, Urine: 1.026 (ref 1.005–1.030)
pH: 6 (ref 5.0–8.0)

## 2020-09-28 NOTE — MAU Note (Signed)
Patient states in the office her BP was 144/97 so she was sent here.  Denies any other blood pressure issues in the past.  Denies HA/visual disturbances/epigastric pain.  Denies LOF/VB/CTX.  Endorses + FM.

## 2020-09-28 NOTE — Discharge Instructions (Signed)
Preeclampsia and Eclampsia °Preeclampsia is a serious condition that may develop during pregnancy. This condition causes high blood pressure and increased protein in your urine along with other symptoms, such as headaches and vision changes. These symptoms may develop as the condition gets worse. Preeclampsia may occur at 20 weeks of pregnancy or later. °Diagnosing and treating preeclampsia early is very important. If not treated early, it can cause serious problems for you and your baby. One problem it can lead to is eclampsia. Eclampsia is a condition that causes muscle jerking or shaking (convulsions or seizures) and other serious problems for the mother. During pregnancy, delivering your baby may be the best treatment for preeclampsia or eclampsia. For most women, preeclampsia and eclampsia symptoms go away after giving birth. °In rare cases, a woman may develop preeclampsia after giving birth (postpartum preeclampsia). This usually occurs within 48 hours after childbirth but may occur up to 6 weeks after giving birth. °What are the causes? °The cause of preeclampsia is not known. °What increases the risk? °The following risk factors make you more likely to develop preeclampsia: °· Being pregnant for the first time. °· Having had preeclampsia during a past pregnancy. °· Having a family history of preeclampsia. °· Having high blood pressure. °· Being pregnant with more than one baby. °· Being 35 or older. °· Being African-American. °· Having kidney disease or diabetes. °· Having medical conditions such as lupus or blood diseases. °· Being very overweight (obese). °What are the signs or symptoms? °The most common symptoms are: °· Severe headaches. °· Vision problems, such as blurred or double vision. °· Abdominal pain, especially upper abdominal pain. °Other symptoms that may develop as the condition gets worse include: °· Sudden weight gain. °· Sudden swelling of the hands, face, legs, and feet. °· Severe nausea  and vomiting. °· Numbness in the face, arms, legs, and feet. °· Dizziness. °· Urinating less than usual. °· Slurred speech. °· Convulsions or seizures. °How is this diagnosed? °There are no screening tests for preeclampsia. Your health care provider will ask you about symptoms and check for signs of preeclampsia during your prenatal visits. You may also have tests that include: °· Checking your blood pressure. °· Urine tests to check for protein. Your health care provider will check for this at every prenatal visit. °· Blood tests. °· Monitoring your baby's heart rate. °· Ultrasound. °How is this treated? °You and your health care provider will determine the treatment approach that is best for you. Treatment may include: °· Having more frequent prenatal exams to check for signs of preeclampsia, if you have an increased risk for preeclampsia. °· Medicine to lower your blood pressure. °· Staying in the hospital, if your condition is severe. There, treatment will focus on controlling your blood pressure and the amount of fluids in your body (fluid retention). °· Taking medicine (magnesium sulfate) to prevent seizures. This may be given as an injection or through an IV. °· Taking a low-dose aspirin during your pregnancy. °· Delivering your baby early. You may have your labor started with medicine (induced), or you may have a cesarean delivery. °Follow these instructions at home: °Eating and drinking ° °· Drink enough fluid to keep your urine pale yellow. °· Avoid caffeine. °Lifestyle °· Do not use any products that contain nicotine or tobacco, such as cigarettes and e-cigarettes. If you need help quitting, ask your health care provider. °· Do not use alcohol or drugs. °· Avoid stress as much as possible. Rest and get   plenty of sleep. General instructions  Take over-the-counter and prescription medicines only as told by your health care provider.  When lying down, lie on your left side. This keeps pressure off your  major blood vessels.  When sitting or lying down, raise (elevate) your feet. Try putting some pillows underneath your lower legs.  Exercise regularly. Ask your health care provider what kinds of exercise are best for you.  Keep all follow-up and prenatal visits as told by your health care provider. This is important. How is this prevented? There is no known way of preventing preeclampsia or eclampsia from developing. However, to lower your risk of complications and detect problems early:  Get regular prenatal care. Your health care provider may be able to diagnose and treat the condition early.  Maintain a healthy weight. Ask your health care provider for help managing weight gain during pregnancy.  Work with your health care provider to manage any long-term (chronic) health conditions you have, such as diabetes or kidney problems.  You may have tests of your blood pressure and kidney function after giving birth.  Your health care provider may have you take low-dose aspirin during your next pregnancy. Contact a health care provider if:  You have symptoms that your health care provider told you may require more treatment or monitoring, such as: ? Headaches. ? Nausea or vomiting. ? Abdominal pain. ? Dizziness. ? Light-headedness. Get help right away if:  You have severe: ? Abdominal pain. ? Headaches that do not get better. ? Dizziness. ? Vision problems. ? Confusion. ? Nausea or vomiting.  You have any of the following: ? A seizure. ? Sudden, rapid weight gain. ? Sudden swelling in your hands, ankles, or face. ? Trouble moving any part of your body. ? Numbness in any part of your body. ? Trouble speaking. ? Abnormal bleeding.  You faint. Summary  Preeclampsia is a serious condition that may develop during pregnancy.  This condition causes high blood pressure and increased protein in your urine along with other symptoms, such as headaches and vision  changes.  Diagnosing and treating preeclampsia early is very important. If not treated early, it can cause serious problems for you and your baby.  Get help right away if you have symptoms that your health care provider told you to watch for. This information is not intended to replace advice given to you by your health care provider. Make sure you discuss any questions you have with your health care provider. Document Revised: 06/24/2018 Document Reviewed: 05/28/2016 Elsevier Patient Education  [place].        Preterm Labor and Birth Information  The normal length of a pregnancy is 39-41 weeks. Preterm labor is when labor starts before 37 completed weeks of pregnancy. What are the risk factors for preterm labor? Preterm labor is more likely to occur in women who:  Have certain infections during pregnancy such as a bladder infection, sexually transmitted infection, or infection inside the uterus (chorioamnionitis).  Have a shorter-than-normal cervix.  Have gone into preterm labor before.  Have had surgery on their cervix.  Are younger than age 30 or older than age 65.  Are African American.  Are pregnant with twins or multiple babies (multiple gestation).  Take street drugs or smoke while pregnant.  Do not gain enough weight while pregnant.  Became pregnant shortly after having been pregnant. What are the symptoms of preterm labor? Symptoms of preterm labor include:  Cramps similar to those that can happen  during a menstrual period. The cramps may happen with diarrhea.  Pain in the abdomen or lower back.  Regular uterine contractions that may feel like tightening of the abdomen.  A feeling of increased pressure in the pelvis.  Increased watery or bloody mucus discharge from the vagina.  Water breaking (ruptured amniotic sac). Why is it important to recognize signs of preterm labor? It is important to recognize signs of preterm labor because babies who  are born prematurely may not be fully developed. This can put them at an increased risk for:  Long-term (chronic) heart and lung problems.  Difficulty immediately after birth with regulating body systems, including blood sugar, body temperature, heart rate, and breathing rate.  Bleeding in the brain.  Cerebral palsy.  Learning difficulties.  Death. These risks are highest for babies who are born before 22 weeks of pregnancy. How is preterm labor treated? Treatment depends on the length of your pregnancy, your condition, and the health of your baby. It may involve:  Having a stitch (suture) placed in your cervix to prevent your cervix from opening too early (cerclage).  Taking or being given medicines, such as: ? Hormone medicines. These may be given early in pregnancy to help support the pregnancy. ? Medicine to stop contractions. ? Medicines to help mature the babys lungs. These may be prescribed if the risk of delivery is high. ? Medicines to prevent your baby from developing cerebral palsy. If the labor happens before 34 weeks of pregnancy, you may need to stay in the hospital. What should I do if I think I am in preterm labor? If you think that you are going into preterm labor, call your health care provider right away. How can I prevent preterm labor in future pregnancies? To increase your chance of having a full-term pregnancy:  Do not use any tobacco products, such as cigarettes, chewing tobacco, and e-cigarettes. If you need help quitting, ask your health care provider.  Do not use street drugs or medicines that have not been prescribed to you during your pregnancy.  Talk with your health care provider before taking any herbal supplements, even if you have been taking them regularly.  Make sure you gain a healthy amount of weight during your pregnancy.  Watch for infection. If you think that you might have an infection, get it checked right away.  Make sure to tell  your health care provider if you have gone into preterm labor before. This information is not intended to replace advice given to you by your health care provider. Make sure you discuss any questions you have with your health care provider. Document Revised: 02/13/2019 Document Reviewed: 03/14/2016 Elsevier Patient Education  2020 [place].        Fetal Movement Counts Patient Name: ________________________________________________ Patient Due Date: ____________________ What is a fetal movement count?  A fetal movement count is the number of times that you feel your baby move during a certain amount of time. This may also be called a fetal kick count. A fetal movement count is recommended for every pregnant woman. You may be asked to start counting fetal movements as early as week 28 of your pregnancy. Pay attention to when your baby is most active. You may notice your baby's sleep and wake cycles. You may also notice things that make your baby move more. You should do a fetal movement count:  When your baby is normally most active.  At the same time each day. A good time to  count movements is while you are resting, after having something to eat and drink. How do I count fetal movements? 1. Find a quiet, comfortable area. Sit, or lie down on your side. 2. Write down the date, the start time and stop time, and the number of movements that you felt between those two times. Take this information with you to your health care visits. 3. Write down your start time when you feel the first movement. 4. Count kicks, flutters, swishes, rolls, and jabs. You should feel at least 10 movements. 5. You may stop counting after you have felt 10 movements, or if you have been counting for 2 hours. Write down the stop time. 6. If you do not feel 10 movements in 2 hours, contact your health care provider for further instructions. Your health care provider may want to do additional tests to assess your  baby's well-being. Contact a health care provider if:  You feel fewer than 10 movements in 2 hours.  Your baby is not moving like he or she usually does. Date: ____________ Start time: ____________ Stop time: ____________ Movements: ____________ Date: ____________ Start time: ____________ Stop time: ____________ Movements: ____________ Date: ____________ Start time: ____________ Stop time: ____________ Movements: ____________ Date: ____________ Start time: ____________ Stop time: ____________ Movements: ____________ Date: ____________ Start time: ____________ Stop time: ____________ Movements: ____________ Date: ____________ Start time: ____________ Stop time: ____________ Movements: ____________ Date: ____________ Start time: ____________ Stop time: ____________ Movements: ____________ Date: ____________ Start time: ____________ Stop time: ____________ Movements: ____________ Date: ____________ Start time: ____________ Stop time: ____________ Movements: ____________ This information is not intended to replace advice given to you by your health care provider. Make sure you discuss any questions you have with your health care provider. Document Revised: 06/11/2019 Document Reviewed: 06/11/2019 Elsevier Patient Education  [place].

## 2020-09-28 NOTE — MAU Provider Note (Signed)
History     CSN: [id number]  Arrival date and time: 09/28/20 1554   First Provider Initiated Contact with Patient 09/28/20 1654      Chief Complaint  Patient presents with  . Hypertension   [name] is a 34 y.o. G3P2003 at [redacted]w[redacted]d who presents to MAU for preeclampsia evaluation after she was seen in the office and found to have new onset elevated pressures. Patient was sent from the office to MAU for further evaluation.  Pt denies HA, blurry vision/seeing spots, N/V, epigastric pain, swelling in face and hands, sudden weight gain. Pt denies chest pain and SOB.  Pt denies constipation, diarrhea, or urinary problems. Pt denies fever, chills, fatigue, sweating or changes in appetite. Pt denies dizziness, light-headedness, weakness.  Pt denies VB, ctx, LOF and reports good FM.   OB History    Gravida  3   Para  2   Term  2   Preterm      AB      Living  3     SAB      TAB      Ectopic      Multiple  1   Live Births  3           Past Medical History:  Diagnosis Date  . Anemia   . History of anemia     Past Surgical History:  Procedure Laterality Date  . 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 Depression Mother   . Breast cancer Maternal Grandmother   . Hypertension Maternal Grandmother   . Heart disease Neg Hx     Social History   Tobacco Use  . Smoking status: Never Smoker  . Smokeless tobacco: Never Used  Vaping Use  . Vaping Use: Never used  Substance Use Topics  . Alcohol use: No  . Drug use: No    Allergies:  Allergies  Allergen Reactions  . Shellfish Allergy Hives and Itching    Medications Prior to Admission  Medication Sig Dispense Refill Last Dose  . aspirin 81 MG chewable tablet Chew by mouth daily.   09/27/2020 at Unknown time  . ferrous sulfate 325 (65 FE) MG tablet Take 325 mg by mouth daily with breakfast.   09/27/2020 at Unknown time   . Prenatal Vit-Fe Fumarate-FA (PRENATAL VITAMIN) 27-0.8 MG TABS Take 1 tablet by mouth daily. 30 tablet 2 09/27/2020 at Unknown time    Review of Systems  Constitutional: Negative for chills, diaphoresis, fatigue and fever.  Eyes: Negative for visual disturbance.  Respiratory: Negative for shortness of breath.   Cardiovascular: Negative for chest pain.  Gastrointestinal: Negative for abdominal pain, constipation, diarrhea, nausea and vomiting.  Genitourinary: Negative for dysuria, flank pain, frequency, pelvic pain, urgency, vaginal bleeding and vaginal discharge.  Neurological: Negative for dizziness, weakness, light-headedness and headaches.   Physical Exam   Blood pressure (!) 142/80, pulse 86, temperature 98.1 F (36.7 C), resp. rate 18, height 5\' 8"  (1.727 m), weight (!) 152.6 kg, last menstrual period 02/05/2020, SpO2 99 %.  Patient Vitals for the past 24 hrs:  BP Temp Pulse Resp SpO2 Height Weight  09/28/20 1931 (!) 142/80 -- 86 -- 99 % -- --  09/28/20 1920 133/83 -- 89 -- 99 % -- --  09/28/20 1916 (!) 151/91 -- 88 -- -- -- --  09/28/20 1901 (!) 150/88 -- 90 -- 100 % -- --  09/28/20 1851 (!) 152/88 -- 87 -- -- -- --  09/28/20 1845 -- -- -- -- 100 % -- --  09/28/20 1840 -- -- -- -- 98 % -- --  09/28/20 1817 123/72 -- 93 -- -- -- --  09/28/20 1815 -- -- -- -- 99 % -- --  09/28/20 1805 -- -- -- -- 99 % -- --  09/28/20 1802 138/79 -- 82 -- -- -- --  09/28/20 1800 -- -- -- -- 99 % -- --  09/28/20 1755 -- -- -- -- 99 % -- --  09/28/20 1750 -- -- -- -- 99 % -- --  09/28/20 1747 134/74 -- 86 -- -- -- --  09/28/20 1745 -- -- -- -- 100 % -- --  09/28/20 1740 -- -- -- -- 99 % -- --  09/28/20 1735 -- -- -- -- 99 % -- --  09/28/20 1732 129/75 -- 94 -- -- -- --  09/28/20 1730 -- -- -- -- 98 % -- --  09/28/20 1725 -- -- -- -- 99 % -- --  09/28/20 1720 -- -- -- -- 98 % -- --  09/28/20 1717 138/85 -- 100 -- -- -- --  09/28/20 1715 -- -- -- -- 99 % -- --  09/28/20 1710 -- -- -- --  98 % -- --  09/28/20 1705 -- -- -- -- 99 % -- --  09/28/20 1701 (!) 146/82 -- 85 -- -- -- --  09/28/20 1700 -- -- -- -- 100 % -- --  09/28/20 1655 -- -- -- -- 99 % -- --  09/28/20 1647 (!) 146/81 -- 85 -- -- -- --  09/28/20 1638 (!) 144/83 -- 87 18 -- -- --  09/28/20 1635 -- -- -- -- 98 % -- --  09/28/20 1630 -- -- -- -- 99 % -- --  09/28/20 1625 -- -- -- -- 99 % -- --  09/28/20 1620 -- -- -- -- 99 % -- --  09/28/20 1609 (!) 130/95 -- (!) 106 -- -- -- --  09/28/20 1606 -- 98.1 F (36.7 C) -- 19 -- 5\' 8"  (1.727 m) (!) 152.6 kg   Physical Exam Vitals and nursing note reviewed.  Constitutional:      General: She is not in acute distress.    Appearance: Normal appearance. She is not ill-appearing, toxic-appearing or diaphoretic.  HENT:     Head: Normocephalic and atraumatic.  Pulmonary:     Effort: Pulmonary effort is normal.  Neurological:     Mental Status: She is alert and oriented to person, place, and time.  Psychiatric:        Mood and Affect: Mood normal.        Behavior: Behavior normal.        Thought Content: Thought content normal.        Judgment: Judgment normal.    Results for orders placed or performed during the hospital encounter of 09/28/20 (from the past 24 hour(s))  CBC     Status: Abnormal   Collection Time: 09/28/20  4:41 PM  Result Value Ref Range   WBC 8.2 4.0 - 10.5 K/uL   RBC 3.66 (L) 3.87 - 5.11 MIL/uL   Hemoglobin 10.1 (L) 12.0 - 15.0 g/dL   HCT 32.0 (L) 36 - 46 %   MCV 87.4 80.0 - 100.0 fL   MCH 27.6 26.0 - 34.0 pg   MCHC 31.6 30.0 - 36.0 g/dL   RDW 13.9 11.5 - 15.5 %   Platelets 337 150 -  400 K/uL   nRBC 0.0 0.0 - 0.2 %  Comprehensive metabolic panel     Status: Abnormal   Collection Time: 09/28/20  4:41 PM  Result Value Ref Range   Sodium 138 135 - 145 mmol/L   Potassium 3.6 3.5 - 5.1 mmol/L   Chloride 103 98 - 111 mmol/L   CO2 22 22 - 32 mmol/L   Glucose, Bld 81 70 - 99 mg/dL   BUN 5 (L) 6 - 20 mg/dL   Creatinine, Ser 0.69 0.44 -  1.00 mg/dL   Calcium 8.7 (L) 8.9 - 10.3 mg/dL   Total Protein 6.7 6.5 - 8.1 g/dL   Albumin 2.4 (L) 3.5 - 5.0 g/dL   AST 26 15 - 41 U/L   ALT 23 0 - 44 U/L   Alkaline Phosphatase 108 38 - 126 U/L   Total Bilirubin 0.6 0.3 - 1.2 mg/dL   GFR, Estimated >60 >60 mL/min   Anion gap 13 5 - 15  Urinalysis, Routine w reflex microscopic Urine, Clean Catch     Status: Abnormal   Collection Time: 09/28/20  6:24 PM  Result Value Ref Range   Color, Urine AMBER (A) YELLOW   APPearance HAZY (A) CLEAR   Specific Gravity, Urine 1.026 1.005 - 1.030   pH 6.0 5.0 - 8.0   Glucose, UA NEGATIVE NEGATIVE mg/dL   Hgb urine dipstick NEGATIVE NEGATIVE   Bilirubin Urine NEGATIVE NEGATIVE   Ketones, ur 80 (A) NEGATIVE mg/dL   Protein, ur 100 (A) NEGATIVE mg/dL   Nitrite NEGATIVE NEGATIVE   Leukocytes,Ua NEGATIVE NEGATIVE   RBC / HPF 0-5 0 - 5 RBC/hpf   WBC, UA 0-5 0 - 5 WBC/hpf   Bacteria, UA RARE (A) NONE SEEN   Squamous Epithelial / LPF 11-20 0 - 5   Mucus PRESENT   Protein / creatinine ratio, urine     Status: Abnormal   Collection Time: 09/28/20  6:24 PM  Result Value Ref Range   Creatinine, Urine 290.20 mg/dL   Total Protein, Urine 61 mg/dL   Protein Creatinine Ratio 0.21 (H) 0.00 - 0.15 mg/mg[Cre]    No results found.  MAU Course  Procedures  MDM -preeclampsia evaluation without severe range BP in MAU on admission -symptoms include: none -UA: amber/hazy/80ketones/100PRO -CBC: H/H 10.1/32.0, platelets 337 -CMP: serum creatinine 0.69, AST/ALT 26/23 -PCr: 0.21 -EFM: reactive       -baseline: 135       -variability: moderate       -accels: present, 15x15       -decels: absent       -TOCO: irritability -called Dr. Alesia Richards to recommend BP check for patient in office on Monday, who agrees with plan, requests MAU provider to call Burman Foster, CNM to relay information about patient -called and notified Clois Dupes, CNM -pt discharged to home in stable condition  Orders Placed This Encounter   Procedures  . Urinalysis, Routine w reflex microscopic Urine, Clean Catch    Standing Status:   Standing    Number of Occurrences:   1  . CBC    Standing Status:   Standing    Number of Occurrences:   1  . Comprehensive metabolic panel    Standing Status:   Standing    Number of Occurrences:   1  . Protein / creatinine ratio, urine    Standing Status:   Standing    Number of Occurrences:   1  . Discharge patient    Order Specific  Question:   Discharge disposition    Answer:   01-Home or Self Care [1]    Order Specific Question:   Discharge patient date    Answer:   09/28/2020   No orders of the defined types were placed in this encounter.   Assessment and Plan   1. Elevated blood pressure affecting pregnancy in third trimester, antepartum   2. [redacted] weeks gestation of pregnancy   3. NST (non-stress test) reactive     Allergies as of 09/28/2020      Reactions   Shellfish Allergy Hives, Itching      Medication List    TAKE these medications   aspirin 81 MG chewable tablet Chew by mouth daily.   ferrous sulfate 325 (65 FE) MG tablet Take 325 mg by mouth daily with breakfast.   Prenatal Vitamin 27-0.8 MG Tabs Take 1 tablet by mouth daily.       -Reviewed warning blood pressure values (systolic = / > 742 and/or diastolic =/> 90). Explained that, if blood pressure is elevated, she should sit down, rest, and eat/drink something. If still elevated 15 minutes later, and she is greater than 20 weeks, she should call clinic or come to MAU. She should come to MAU if she has elevated pressures and any of the following:  - headache not relieved with tylenol, rest, hydration -blurry vision, floating spots in her vision - sudden full-body edema or facial edema -RUQ pain that is constant.  These symptoms may indicate that her blood pressure is worsening and she may be developing gestational hypertension or pre-eclampsia, which is an emergency.  -return MAU precautions -pt  discharged to home in stable condition   Gerrie Nordmann Envi Eagleson 09/28/2020, 7:52 PM

## 2020-10-07 ENCOUNTER — Inpatient Hospital Stay (HOSPITAL_COMMUNITY)
Admission: AD | Admit: 2020-10-07 | Discharge: 2020-10-07 | Disposition: A | Discharge status: Home / Self Care | Payer: Medicaid Other | Attending: Obstetrics & Gynecology | Admitting: Obstetrics & Gynecology

## 2020-10-07 ENCOUNTER — Encounter (HOSPITAL_COMMUNITY): Payer: Self-pay | Admitting: Obstetrics & Gynecology

## 2020-10-07 ENCOUNTER — Other Ambulatory Visit: Payer: Self-pay

## 2020-10-07 DIAGNOSIS — O26893 Other specified pregnancy related conditions, third trimester: Secondary | ICD-10-CM | POA: Insufficient documentation

## 2020-10-07 DIAGNOSIS — O133 Gestational [pregnancy-induced] hypertension without significant proteinuria, third trimester: Secondary | ICD-10-CM | POA: Diagnosis present

## 2020-10-07 DIAGNOSIS — Z8249 Family history of ischemic heart disease and other diseases of the circulatory system: Secondary | ICD-10-CM | POA: Insufficient documentation

## 2020-10-07 DIAGNOSIS — Z3A35 35 weeks gestation of pregnancy: Secondary | ICD-10-CM | POA: Diagnosis not present

## 2020-10-07 DIAGNOSIS — Z7982 Long term (current) use of aspirin: Secondary | ICD-10-CM | POA: Insufficient documentation

## 2020-10-07 DIAGNOSIS — R030 Elevated blood-pressure reading, without diagnosis of hypertension: Secondary | ICD-10-CM | POA: Diagnosis not present

## 2020-10-07 LAB — CBC
HCT: 31.6 % — ABNORMAL LOW (ref 36.0–46.0)
Hemoglobin: 9.7 g/dL — ABNORMAL LOW (ref 12.0–15.0)
MCH: 27 pg (ref 26.0–34.0)
MCHC: 30.7 g/dL (ref 30.0–36.0)
MCV: 88 fL (ref 80.0–100.0)
Platelets: 337 10*3/uL (ref 150–400)
RBC: 3.59 MIL/uL — ABNORMAL LOW (ref 3.87–5.11)
RDW: 14.3 % (ref 11.5–15.5)
WBC: 6.5 10*3/uL (ref 4.0–10.5)
nRBC: 0 % (ref 0.0–0.2)

## 2020-10-07 LAB — PROTEIN / CREATININE RATIO, URINE
Creatinine, Urine: 467.19 mg/dL
Protein Creatinine Ratio: 0.1 mg/mg{Cre} (ref 0.00–0.15)
Total Protein, Urine: 49 mg/dL

## 2020-10-07 LAB — COMPREHENSIVE METABOLIC PANEL
ALT: 18 U/L (ref 0–44)
AST: 19 U/L (ref 15–41)
Albumin: 2.4 g/dL — ABNORMAL LOW (ref 3.5–5.0)
Alkaline Phosphatase: 117 U/L (ref 38–126)
Anion gap: 11 (ref 5–15)
BUN: 5 mg/dL — ABNORMAL LOW (ref 6–20)
CO2: 24 mmol/L (ref 22–32)
Calcium: 9.4 mg/dL (ref 8.9–10.3)
Chloride: 100 mmol/L (ref 98–111)
Creatinine, Ser: 0.71 mg/dL (ref 0.44–1.00)
GFR, Estimated: >60 mL/min (ref >60)
Glucose, Bld: 111 mg/dL — ABNORMAL HIGH (ref 70–99)
Potassium: 3.7 mmol/L (ref 3.5–5.1)
Sodium: 135 mmol/L (ref 135–145)
Total Bilirubin: 0.5 mg/dL (ref 0.3–1.2)
Total Protein: 6.4 g/dL — ABNORMAL LOW (ref 6.5–8.1)

## 2020-10-07 NOTE — Discharge Instructions (Signed)

## 2020-10-07 NOTE — Discharge Summary (Addendum)
ANTENATAL DISCHARGE INSTRUCTIONS Date: 10/07/2020   Time: 3:26 PM PRETERM LABOR: Includes any of the following symptoms that occur between 20-[redacted] weeks gestation. If these symptoms are not stopped, preterm labor can result in preterm delivery, placing your baby at risk.  Notify your doctor if any of the following occur: 1. Menstrual-like cramps   5. Pelvic pressure  2. Uterine contractions. These may be painless and feel like the uterus is tightening or the baby is "balling up" 6. Increase or change in vaginal discharge  3. Low, dull backache, unrelieved by heat or Tylenol  7. Vaginal bleeding  4. Intestinal cramps, with our without diarrhea, sometimes 8. A general feeling that "something is not right"   9. Leaking of fluid described as "gas pain"   CBC    Component Value Date/Time   WBC 6.5 10/07/2020 1333   RBC 3.59 (L) 10/07/2020 1333   HGB 9.7 (L) 10/07/2020 1333   HCT 31.6 (L) 10/07/2020 1333   PLT 337 10/07/2020 1333   MCV 88.0 10/07/2020 1333   MCH 27.0 10/07/2020 1333   MCHC 30.7 10/07/2020 1333   RDW 14.3 10/07/2020 1333   CMP     Component Value Date/Time   NA 135 10/07/2020 1333   K 3.7 10/07/2020 1333   CL 100 10/07/2020 1333   CO2 24 10/07/2020 1333   GLUCOSE 111 (H) 10/07/2020 1333   BUN 5 (L) 10/07/2020 1333   CREATININE 0.71 10/07/2020 1333   CALCIUM 9.4 10/07/2020 1333   PROT 6.4 (L) 10/07/2020 1333   ALBUMIN 2.4 (L) 10/07/2020 1333   AST 19 10/07/2020 1333   ALT 18 10/07/2020 1333   ALKPHOS 117 10/07/2020 1333   BILITOT 0.5 10/07/2020 1333   GFRNONAA >60 10/07/2020 1333   Protein creatinine ratio 0.1  Today's Vitals   10/07/20 1431 10/07/20 1446 10/07/20 1501 10/07/20 1531  BP: 120/82 124/78 118/70   Pulse: 88 89 92   Resp:    18  Weight:      PainSc:    0-No pain   Body mass index is 51.35 kg/m.  A. MEDICATION PRESCRIBED FOR HOME:  N/A  Fill all the prescriptions ordered by your doctor and be sure to take the   entire dose as directed. Any  time you go for emergency treatment, bring   your medicine. B. WOUND CARE: none C. DIET AT HOME: regular D. ACTIVITY: No restrictions E. SEXUAL ACTIVITY: No restrictions F. FOLLOW-UP CARE:  Return to: [place]  Date: 10/10/20 Time: call office for appointment   Referral to [place]: N/A    G. DISCHARGE TEACHING: Pt verbalized understanding of instructions H. MATERNAL DISCHARGE TO: Home  Burman Foster, MSN, CNM 10/07/2020 3:28 PM

## 2020-10-07 NOTE — MAU Note (Signed)
Pt sent from office with elevated b/p 150/98. Pt denies any headache, or visual changes no abd pain or swelling. Good fetal movement reported.

## 2020-10-07 NOTE — H&P (Addendum)
  History     CSN: [id number]  Arrival date and time: 10/07/20 1242   None     Chief Complaint  Patient presents with  . Hypertension   HPI  Presents from office w/ elevated BP. Denies HA, visual changes, and epigastric pain. Pt was seen last week for PIH work-up, CBC and CMP essentially normal and protein creatine ratio 0.21. Pt denies feeling ctx, perceives active FM, denies any other complaints/concerns.   OB History    Gravida  3   Para  2   Term  2   Preterm      AB      Living  3     SAB      TAB      Ectopic      Multiple  1   Live Births  3           Past Medical History:  Diagnosis Date  . Anemia   . History of anemia     Past Surgical History:  Procedure Laterality Date  . 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 Depression Mother   . Breast cancer Maternal Grandmother   . Hypertension Maternal Grandmother   . Heart disease Neg Hx     Social History   Tobacco Use  . Smoking status: Never Smoker  . Smokeless tobacco: Never Used  Vaping Use  . Vaping Use: Never used  Substance Use Topics  . Alcohol use: No  . Drug use: No    Allergies:  Allergies  Allergen Reactions  . Shellfish Allergy Hives and Itching    Medications Prior to Admission  Medication Sig Dispense Refill Last Dose  . aspirin 81 MG chewable tablet Chew by mouth daily.   10/07/2020 at Unknown time  . ferrous sulfate 325 (65 FE) MG tablet Take 325 mg by mouth daily with breakfast.   10/07/2020 at Unknown time  . Prenatal Vit-Fe Fumarate-FA (PRENATAL VITAMIN) 27-0.8 MG TABS Take 1 tablet by mouth daily. 30 tablet 2 10/07/2020 at Unknown time    Review of Systems  All other systems reviewed and are negative.  Physical Exam   Blood pressure 135/82, pulse (!) 109, resp. rate 18, weight (!) 153.2 kg, last menstrual period 02/05/2020.  Physical Exam HENT:     Head: Normocephalic.   Cardiovascular:     Rate and Rhythm: Normal rate.  Pulmonary:     Effort: Pulmonary effort is normal.  Abdominal:     Palpations: Abdomen is soft.  Musculoskeletal:     Right lower leg: Edema present.     Left lower leg: Edema present.  Neurological:     Mental Status: She is alert.    FHR 135 baseline, moderate variability, accels present, decels absent TOCO no ctx MAU Course  Procedures  CBC CMP Protein creatinine ratio NST    Assessment and Plan  34 y.o. G3P2003 [redacted]w[redacted]d PIH vs pre-eclampsia work up  Will discuss w/ Dr. Alwyn Pea.  [name] 10/07/2020, 1:32 PM

## 2020-10-11 ENCOUNTER — Other Ambulatory Visit: Payer: Medicaid Other

## 2020-10-11 DIAGNOSIS — Z20822 Contact with and (suspected) exposure to covid-19: Secondary | ICD-10-CM

## 2020-10-11 LAB — OB RESULTS CONSOLE GBS: GBS: NEGATIVE

## 2020-10-12 ENCOUNTER — Encounter (HOSPITAL_COMMUNITY): Payer: Self-pay

## 2020-10-12 ENCOUNTER — Emergency Department (HOSPITAL_COMMUNITY): Payer: Medicaid Other

## 2020-10-12 ENCOUNTER — Inpatient Hospital Stay (HOSPITAL_COMMUNITY)
Admission: AD | Admit: 2020-10-12 | Discharge: 2020-10-14 | DRG: 832 | Disposition: A | Discharge status: Home / Self Care | Payer: Medicaid Other | Attending: Obstetrics and Gynecology | Admitting: Obstetrics and Gynecology

## 2020-10-12 ENCOUNTER — Other Ambulatory Visit: Payer: Self-pay

## 2020-10-12 ENCOUNTER — Observation Stay (HOSPITAL_COMMUNITY): Payer: Medicaid Other

## 2020-10-12 DIAGNOSIS — W1809XA Striking against other object with subsequent fall, initial encounter: Secondary | ICD-10-CM | POA: Diagnosis present

## 2020-10-12 DIAGNOSIS — D252 Subserosal leiomyoma of uterus: Secondary | ICD-10-CM | POA: Diagnosis present

## 2020-10-12 DIAGNOSIS — O133 Gestational [pregnancy-induced] hypertension without significant proteinuria, third trimester: Secondary | ICD-10-CM

## 2020-10-12 DIAGNOSIS — O1490 Unspecified pre-eclampsia, unspecified trimester: Secondary | ICD-10-CM | POA: Diagnosis present

## 2020-10-12 DIAGNOSIS — D649 Anemia, unspecified: Secondary | ICD-10-CM | POA: Diagnosis present

## 2020-10-12 DIAGNOSIS — S0181XA Laceration without foreign body of other part of head, initial encounter: Secondary | ICD-10-CM

## 2020-10-12 DIAGNOSIS — O1493 Unspecified pre-eclampsia, third trimester: Secondary | ICD-10-CM | POA: Diagnosis present

## 2020-10-12 DIAGNOSIS — O99891 Other specified diseases and conditions complicating pregnancy: Principal | ICD-10-CM | POA: Diagnosis present

## 2020-10-12 DIAGNOSIS — W19XXXA Unspecified fall, initial encounter: Secondary | ICD-10-CM | POA: Diagnosis present

## 2020-10-12 DIAGNOSIS — R55 Syncope and collapse: Secondary | ICD-10-CM | POA: Diagnosis present

## 2020-10-12 DIAGNOSIS — Y92238 Other place in hospital as the place of occurrence of the external cause: Secondary | ICD-10-CM | POA: Diagnosis present

## 2020-10-12 DIAGNOSIS — O99013 Anemia complicating pregnancy, third trimester: Secondary | ICD-10-CM | POA: Diagnosis present

## 2020-10-12 DIAGNOSIS — Z3A35 35 weeks gestation of pregnancy: Secondary | ICD-10-CM

## 2020-10-12 DIAGNOSIS — Z20822 Contact with and (suspected) exposure to covid-19: Secondary | ICD-10-CM | POA: Diagnosis present

## 2020-10-12 DIAGNOSIS — S01111A Laceration without foreign body of right eyelid and periocular area, initial encounter: Secondary | ICD-10-CM | POA: Diagnosis present

## 2020-10-12 DIAGNOSIS — O99213 Obesity complicating pregnancy, third trimester: Secondary | ICD-10-CM | POA: Diagnosis present

## 2020-10-12 DIAGNOSIS — E559 Vitamin D deficiency, unspecified: Secondary | ICD-10-CM | POA: Diagnosis present

## 2020-10-12 DIAGNOSIS — O3413 Maternal care for benign tumor of corpus uteri, third trimester: Secondary | ICD-10-CM | POA: Diagnosis present

## 2020-10-12 DIAGNOSIS — E669 Obesity, unspecified: Secondary | ICD-10-CM | POA: Diagnosis present

## 2020-10-12 LAB — COMPREHENSIVE METABOLIC PANEL
ALT: 15 U/L (ref 0–44)
AST: 19 U/L (ref 15–41)
Albumin: 2.5 g/dL — ABNORMAL LOW (ref 3.5–5.0)
Alkaline Phosphatase: 118 U/L (ref 38–126)
Anion gap: 11 (ref 5–15)
BUN: 5 mg/dL — ABNORMAL LOW (ref 6–20)
CO2: 21 mmol/L — ABNORMAL LOW (ref 22–32)
Calcium: 8.6 mg/dL — ABNORMAL LOW (ref 8.9–10.3)
Chloride: 104 mmol/L (ref 98–111)
Creatinine, Ser: 0.65 mg/dL (ref 0.44–1.00)
GFR, Estimated: >60 mL/min (ref >60)
Glucose, Bld: 113 mg/dL — ABNORMAL HIGH (ref 70–99)
Potassium: 3.3 mmol/L — ABNORMAL LOW (ref 3.5–5.1)
Sodium: 136 mmol/L (ref 135–145)
Total Bilirubin: 0.3 mg/dL (ref 0.3–1.2)
Total Protein: 6.1 g/dL — ABNORMAL LOW (ref 6.5–8.1)

## 2020-10-12 LAB — ABO/RH: ABO/RH(D): A POS

## 2020-10-12 LAB — TROPONIN I (HIGH SENSITIVITY)
Troponin I (High Sensitivity): 8 ng/L (ref <18)
Troponin I (High Sensitivity): 3 ng/L (ref <18)

## 2020-10-12 LAB — CBC
HCT: 32.2 % — ABNORMAL LOW (ref 36.0–46.0)
Hemoglobin: 10.1 g/dL — ABNORMAL LOW (ref 12.0–15.0)
MCH: 28.1 pg (ref 26.0–34.0)
MCHC: 31.4 g/dL (ref 30.0–36.0)
MCV: 89.4 fL (ref 80.0–100.0)
Platelets: 317 10*3/uL (ref 150–400)
RBC: 3.6 MIL/uL — ABNORMAL LOW (ref 3.87–5.11)
RDW: 14.8 % (ref 11.5–15.5)
WBC: 7.4 10*3/uL (ref 4.0–10.5)
nRBC: 0 % (ref 0.0–0.2)

## 2020-10-12 LAB — I-STAT CHEM 8, ED
BUN: 4 mg/dL — ABNORMAL LOW (ref 6–20)
Calcium, Ion: 1.11 mmol/L — ABNORMAL LOW (ref 1.15–1.40)
Chloride: 104 mmol/L (ref 98–111)
Creatinine, Ser: 0.7 mg/dL (ref 0.44–1.00)
Glucose, Bld: 117 mg/dL — ABNORMAL HIGH (ref 70–99)
HCT: 28 % — ABNORMAL LOW (ref 36.0–46.0)
Hemoglobin: 9.5 g/dL — ABNORMAL LOW (ref 12.0–15.0)
Potassium: 3.3 mmol/L — ABNORMAL LOW (ref 3.5–5.1)
Sodium: 137 mmol/L (ref 135–145)
TCO2: 23 mmol/L (ref 22–32)

## 2020-10-12 LAB — CBG MONITORING, ED: Glucose-Capillary: 119 mg/dL — ABNORMAL HIGH (ref 70–99)

## 2020-10-12 LAB — PROTEIN / CREATININE RATIO, URINE
Creatinine, Urine: 51.73 mg/dL
Protein Creatinine Ratio: 0.62 mg/mg{Cre} — ABNORMAL HIGH (ref 0.00–0.15)
Total Protein, Urine: 32 mg/dL

## 2020-10-12 LAB — TYPE AND SCREEN
ABO/RH(D): A POS
Antibody Screen: NEGATIVE

## 2020-10-12 LAB — RESP PANEL BY RT-PCR (FLU A&B, COVID) ARPGX2
Influenza A by PCR: NEGATIVE
Influenza B by PCR: NEGATIVE
SARS Coronavirus 2 by RT PCR: NEGATIVE

## 2020-10-12 MED ORDER — SODIUM CHLORIDE 0.9 % IV BOLUS (SEPSIS)
1000.0000 mL | Freq: Once | INTRAVENOUS | Status: AC
  Administered 2020-10-12: 1000 mL via INTRAVENOUS

## 2020-10-12 MED ORDER — VITAMIN D 25 MCG (1000 UNIT) PO TABS
4000.0000 [IU] | ORAL_TABLET | Freq: Every day | ORAL | Status: DC
  Administered 2020-10-12 – 2020-10-14 (×3): 4000 [IU] via ORAL
  Filled 2020-10-12 (×3): qty 4

## 2020-10-12 MED ORDER — PRENATAL MULTIVITAMIN CH
1.0000 | ORAL_TABLET | Freq: Every day | ORAL | Status: DC
  Administered 2020-10-12 – 2020-10-14 (×3): 1 via ORAL
  Filled 2020-10-12 (×4): qty 1

## 2020-10-12 MED ORDER — ACETAMINOPHEN 325 MG PO TABS: 650.0000 mg | ORAL_TABLET | ORAL | Status: DC | PRN

## 2020-10-12 MED ORDER — SODIUM CHLORIDE 0.9% FLUSH
3.0000 mL | Freq: Two times a day (BID) | INTRAVENOUS | Status: DC
  Administered 2020-10-12 – 2020-10-14 (×4): 3 mL via INTRAVENOUS

## 2020-10-12 MED ORDER — DOCUSATE SODIUM 100 MG PO CAPS
100.0000 mg | ORAL_CAPSULE | Freq: Every day | ORAL | Status: DC
  Administered 2020-10-13 – 2020-10-14 (×2): 100 mg via ORAL
  Filled 2020-10-12 (×2): qty 1

## 2020-10-12 MED ORDER — SODIUM CHLORIDE 0.9 % IV SOLN
1000.0000 mL | INTRAVENOUS | Status: DC
  Administered 2020-10-12 (×2): 1000 mL via INTRAVENOUS

## 2020-10-12 MED ORDER — LACTATED RINGERS IV SOLN: INTRAVENOUS | Status: DC

## 2020-10-12 MED ORDER — IOHEXOL 350 MG/ML SOLN
75.0000 mL | Freq: Once | INTRAVENOUS | Status: AC | PRN
  Administered 2020-10-12: 75 mL via INTRAVENOUS

## 2020-10-12 MED ORDER — ZOLPIDEM TARTRATE 5 MG PO TABS: 5.0000 mg | ORAL_TABLET | Freq: Every evening | ORAL | Status: DC | PRN

## 2020-10-12 MED ORDER — CALCIUM CARBONATE ANTACID 500 MG PO CHEW: 2.0000 | CHEWABLE_TABLET | ORAL | Status: DC | PRN

## 2020-10-12 NOTE — Progress Notes (Signed)
Pt becomes agitated and diaphoretic again during dermabond placement.   Pt is hypotensive again. Second IV started and bp return to normal.  When pt sleeps her O2 sats drop into the mid 80s.  Pt has to be aroused and instructed to take slow deep breaths before readings become to normal.  ED MD made aware.

## 2020-10-12 NOTE — Progress Notes (Signed)
[facility] called to come and assess pt who is G3P3 at [place] who was a visiting her husband in the ED and had a syncopal episode.  Pt reports PIH with this pregnancy.  She has a hx of twin delivery.  Pt fell on her back and not abdomen.  PT reports no LOF, Vag bleeding, or UCs.

## 2020-10-12 NOTE — ED Provider Notes (Signed)
[facility] EMERGENCY DEPARTMENT Provider Note   CSN: [id number] Arrival date & time: 10/12/20  4010     History Chief Complaint  Patient presents with  . Fall    Lydia Mendoza is a 34 y.o. female. G3p2 at 29+5 ega  HPI   Patient presented to the ED for evaluation after a syncopal episode.  Patient was actually here in the emergency room accompanying her husband who is being evaluated/patient unfortunately had been in the ED since yesterday afternoon.  Last time she ate anything was yesterday afternoon.  Patient was sitting at the bedside with her husband when she started to feel lightheaded and dizzy.  Patient felt like she had to get something to drink.  She was walking out to the nursing station to get something to drink when she had a witnessed syncopal episode.  Patient fell and hit her head against the counter.  She did sustain a small laceration above her right eyelid.  Patient did regain consciousness quickly but she was noted to be diaphoretic.  Patient was assisted to a gurney.  She states she still does feel thirsty.  She is not having any abdominal pain.  She has not noticed any vaginal bleeding.  She is not having any fevers or chills.  No chest pain or shortness of breath.  Past Medical History:  Diagnosis Date  . Anemia   . History of anemia     Patient Active Problem List   Diagnosis Date Noted  . Fall 10/12/2020  . PIH (pregnancy induced hypertension), third trimester 10/07/2020  . Seasonal allergic rhinitis due to pollen 03/11/2019    Past Surgical History:  Procedure Laterality Date  . FOOT SURGERY Left    age 58 due to congenital deformity     OB History    Gravida  3   Para  2   Term  2   Preterm      AB      Living  3     SAB      TAB      Ectopic      Multiple  1   Live Births  3           Family History  Problem Relation Age of Onset  . Depression Mother   . Breast cancer Maternal Grandmother   .  Hypertension Maternal Grandmother   . Heart disease Neg Hx     Social History   Tobacco Use  . Smoking status: Never Smoker  . Smokeless tobacco: Never Used  Vaping Use  . Vaping Use: Never used  Substance Use Topics  . Alcohol use: No  . Drug use: No    Home Medications Prior to Admission medications   Medication Sig Start Date End Date Taking? Authorizing Provider  aspirin 81 MG chewable tablet Chew by mouth daily.    [provider]  ferrous sulfate 325 (65 FE) MG tablet Take 325 mg by mouth daily with breakfast.    [provider]  Prenatal Vit-Fe Fumarate-FA (PRENATAL VITAMIN) 27-0.8 MG TABS Take 1 tablet by mouth daily. 03/21/20   Nicolette Bang, DO    Allergies    Shellfish allergy  Review of Systems   Review of Systems  All other systems reviewed and are negative.   Physical Exam Updated Vital Signs BP (!) 142/83   Pulse 82   Temp (!) 97.5 F (36.4 C) (Oral)   Resp (!) 24   LMP 02/05/2020 (Exact  Date)   SpO2 100%   Physical Exam Vitals and nursing note reviewed.  Constitutional:      Appearance: She is well-developed. She is diaphoretic.  HENT:     Head: Normocephalic.     Comments: Small laceration above her right eye    Right Ear: External ear normal.     Left Ear: External ear normal.  Eyes:     General: No scleral icterus.       Right eye: No discharge.        Left eye: No discharge.     Conjunctiva/sclera: Conjunctivae normal.  Neck:     Trachea: No tracheal deviation.  Cardiovascular:     Rate and Rhythm: Normal rate and regular rhythm.  Pulmonary:     Effort: Pulmonary effort is normal. No respiratory distress.     Breath sounds: Normal breath sounds. No stridor. No wheezing or rales.  Abdominal:     General: Bowel sounds are normal.     Palpations: Abdomen is soft.     Tenderness: There is no abdominal tenderness. There is no guarding or rebound.     Comments: Gravid uterus, nontender  Musculoskeletal:         General: No tenderness.     Cervical back: Neck supple.  Skin:    General: Skin is warm.     Findings: No rash.  Neurological:     Mental Status: She is alert.     Cranial Nerves: No cranial nerve deficit (no facial droop, extraocular movements intact, no slurred speech).     Sensory: No sensory deficit.     Motor: No abnormal muscle tone or seizure activity.     Coordination: Coordination normal.     ED Results / Procedures / Treatments   Labs (all labs ordered are listed, but only abnormal results are displayed) Labs Reviewed  CBC - Abnormal; Notable for the following components:      Result Value   RBC 3.60 (*)    Hemoglobin 10.1 (*)    HCT 32.2 (*)    All other components within normal limits  I-STAT CHEM 8, ED - Abnormal; Notable for the following components:   Potassium 3.3 (*)    BUN 4 (*)    Glucose, Bld 117 (*)    Calcium, Ion 1.11 (*)    Hemoglobin 9.5 (*)    HCT 28.0 (*)    All other components within normal limits  RESP PANEL BY RT-PCR (FLU A&B, COVID) [id number]  COMPREHENSIVE METABOLIC PANEL  PROTEIN / CREATININE RATIO, URINE  TYPE AND SCREEN  TROPONIN I (HIGH SENSITIVITY)  TROPONIN I (HIGH SENSITIVITY)    EKG None  Radiology CT Angio Chest PE W and/or Wo Contrast  Result Date: 10/12/2020 CLINICAL DATA:  Syncope.  Patient is [redacted] weeks pregnant. EXAM: CT ANGIOGRAPHY CHEST WITH CONTRAST TECHNIQUE: Multidetector CT imaging of the chest was performed using the standard protocol during bolus administration of intravenous contrast. Multiplanar CT image reconstructions and MIPs were obtained to evaluate the vascular anatomy. CONTRAST:  18mL OMNIPAQUE IOHEXOL 350 MG/ML SOLN COMPARISON:  None. FINDINGS: Cardiovascular: Satisfactory opacification of the pulmonary arteries to the segmental level. No evidence of pulmonary embolism. Thoracic aorta is normal in course and caliber. Normal heart size. Trace pericardial effusion. Mediastinum/Nodes: No enlarged mediastinal,  hilar, or axillary lymph nodes. Thyroid gland, trachea, and esophagus demonstrate no significant findings. Lungs/Pleura: Mild dependent bibasilar subsegmental atelectasis. Lungs are otherwise clear. No pleural effusion or pneumothorax. Upper Abdomen: Small hiatal hernia. Musculoskeletal:  No chest wall abnormality. No acute or significant osseous findings. Review of the MIP images confirms the above findings. IMPRESSION: 1. No evidence of pulmonary embolism or other acute intrathoracic process. 2. Trace pericardial effusion. 3. Small hiatal hernia. Electronically Signed   By: Davina Poke D.O.   On: 10/12/2020 09:40    Procedures .Marland KitchenLaceration Repair  Date/Time: 10/12/2020 9:08 AM Performed by: Dorie Rank, MD Authorized by: Dorie Rank, MD   Consent:    Consent obtained:  Verbal   Consent given by:  Patient   Risks discussed:  Infection, need for additional repair, pain, poor cosmetic result and poor wound healing   Alternatives discussed:  No treatment and delayed treatment Universal protocol:    Procedure explained and questions answered to patient or proxy's satisfaction: yes     Relevant documents present and verified: yes     Test results available and properly labeled: yes     Imaging studies available: yes     Required blood products, implants, devices, and special equipment available: yes     Site/side marked: yes     Immediately prior to procedure, a time out was called: yes     Patient identity confirmed:  Verbally with patient Anesthesia (see MAR for exact dosages):    Anesthesia method:  None Laceration details:    Location: eyelid.   Length (cm):  1.5 Repair type:    Repair type:  Simple Exploration:    Wound exploration: wound explored through full range of motion     Wound extent comment:  Superficial eyelid laceration, does not involve deeper structures   Contaminated: no   Treatment:    Area cleansed with:  Saline   Amount of cleaning:  Standard Skin repair:     Repair method:  Tissue adhesive Approximation:    Approximation:  Close   (including critical care time)  Medications Ordered in ED Medications  sodium chloride 0.9 % bolus 1,000 mL (0 mLs Intravenous Stopped 10/12/20 0903)    Followed by  0.9 %  sodium chloride infusion (1,000 mLs Intravenous New Bag/Given 10/12/20 0947)  acetaminophen (TYLENOL) tablet 650 mg (has no administration in time range)  zolpidem (AMBIEN) tablet 5 mg (has no administration in time range)  docusate sodium (COLACE) capsule 100 mg (has no administration in time range)  calcium carbonate (TUMS - dosed in mg elemental calcium) chewable tablet 400 mg of elemental calcium (has no administration in time range)  prenatal multivitamin tablet 1 tablet (has no administration in time range)  lactated ringers infusion (has no administration in time range)  iohexol (OMNIPAQUE) 350 MG/ML injection 75 mL (75 mLs Intravenous Contrast Given 10/12/20 0929)    ED Course  I have reviewed the triage vital signs and the nursing notes.  Pertinent labs & imaging results that were available during my care of the patient were reviewed by me and considered in my medical decision making (see chart for details).  Clinical Course as of Oct 13 1011  Wed Oct 12, 2020  1610 I-STAT reviewed.  Hemoglobin is 9.5 otherwise electrolytes unremarkable.  Glucose is 117   [JK]  0814 Hemoglobin was 9.7 previously.  No significant change   [JK]  0840 While at the bedside patient had another episode of hypotension down into the 80s.  Her pulse ox also transiently decreased.  She is getting IV fluids.  Denies any chest pain or shortness of breath but PE is a concern.   [JK]  [id number] Discussed with Dr Charlesetta Garibaldi.  Will proceed with CT angio workup in ED first   [JK]  0921 Blood pressure improved oxygenation normal.   [JK]  0959 CT angiogram without evidence of PE.  Trace pericardial effusion.   [JK]    Clinical Course User Index [JK] Dorie Rank, MD    MDM Rules/Calculators/A&P                         Patient presented with syncopal episode.  May be related to her decreased p.o. intake.,  Dehydration and orthostatic hypotension also concerned.  Less likely cardiac dysrhythmia, anemia.  Currently no signs of infection.  At this time no signs of any pregnancy related complication as she is not having abdominal pain vaginal bleeding or findings to suggest rupture of membranes  We will proceed with laboratory tests, EKG, give patient something to eat and IV fluids.  Considering her pregnancy she is currently on the monitors and the rapid response OB team is evaluating the patient.  She denies abdominal pain but she is pregnant and did fall.  Laboratory tests show stable anemia.  Electrolyte panel without significant abnormalities.  Patient's Covid test is negative.  Patient has remained hemodynamically stable now at this point with normal blood pressure oxygenation and pulse rate.  CT angiogram does not show any evidence of PE.  Etiology of her syncope unclear.  ?rothostatic  Plan is for the patient to be admitted to the [facility] service for further monitoring.  Head CT requested and ordered by Dr. Charlesetta Garibaldi is pending.  Final Clinical Impression(s) / ED Diagnoses Final diagnoses:  Facial laceration, initial encounter  Syncope, unspecified syncope type      Dorie Rank, MD 10/12/20 1015

## 2020-10-12 NOTE — ED Notes (Signed)
Dr.Knapp at bedside  

## 2020-10-12 NOTE — Progress Notes (Signed)
Pt becomes extremely diaphoretic and slightly agitated.  BP taken and pt hypotensive.  ED nurse made aware.  Orders for fluids to be given.  Dr Charlesetta Garibaldi called and notified about pt. FHR is reactive not reassuring as of yet. Md also made aware of orders that had been given by ED.  Dr Charlesetta Garibaldi wants cmp, pcr, and covid test ran.  When pt is cleared by ED, pt is to be admitted to [facility].

## 2020-10-12 NOTE — ED Notes (Signed)
Dr. Tomi Bamberger at bedside repairing lac to right eye lid

## 2020-10-12 NOTE — H&P (Addendum)
Lydia Mendoza is a 34 y.o. female, G3P2003, IUP at 35.5 weeks, presenting for near syncopal episode with episodic altered mental status changes. Pt came from the ED.   ED HPI:  Patient presented to the ED for evaluation after a syncopal episode.  Patient was actually here in the emergency room accompanying her husband who is being evaluated/patient unfortunately had been in the ED since yesterday afternoon.  Last time she ate anything was yesterday afternoon.  Patient was sitting at the bedside with her husband when she started to feel lightheaded and dizzy.  Patient felt like she had to get something to drink.  She was walking out to the nursing station to get something to drink when she had a witnessed syncopal episode.  Patient fell and hit her head against the counter.  She did sustain a small laceration above her right eyelid.  Patient did regain consciousness quickly but she was noted to be diaphoretic.  Patient was assisted to a gurney.  She states she still does feel thirsty.  She is not having any abdominal pain.  She has not noticed any vaginal bleeding.  She is not having any fevers or chills.  No chest pain or shortness of breath.  Pt currently 12/8 @ 0945 returned from CTA scan which resulted: 1. No evidence of pulmonary embolism or other acute intrathoracic process. 2. Trace pericardial effusion. 3. Small hiatal hernia. Unremarkable EKG, troponin's, Potassium slight low at 3.3, covid and influenza test negative, glucose 117. Pt endorses feeling much better after having a snack, sleep and IV fluids. Pt appears stable, unremarkable neuro exam, under the impression her near syncopal episode were caused from vaso vagal/otherstaic hypotension due to dehydration, sleep depravation. Pt verbalized to me that she lost her balance and her depth perception was off, pt went to nursing station counter for water and went to lean her arm against the counter and missed which then made her hit her head on the  counter and rolled, landed on her back, denies abdomen being hit, pt denies losing conscious, she endorses reraining a walk  During the episode, RN reported pt later fell asleep and was hard to wake her, she was not responsive to touch, had x2 episodes of becoming agitated and diaphoretic, decreased o2 stats during sleeping with denials of a h/o sleep apneia  GHTN: Pt was dx with [facility] with GHTN, PCR pending today, 0.10 (5 days ago), 0.21 (2 weeks ago)  , creatinine 0.7, HGB 10.1, platelets 317, denies HA, RUQ pain or vision changes, denies CP, SOB, abdominal pain, lower extremities deficits. Pt endorses feeling fine now with full strength in all extremities, denies pain except on superior right eye from lacerations. Endorses +FM, denies vaginal bleeding, cramps, pain or leakage of fluids. Report last [place] was yesterday (SINGLETON PREGNANCY. VERTEX PRESENTATION. POSTERIOR PLACENTA. CERVIX APPEARS CLOSED AND MEASURES 4.9CM TRANSABDOMINALLY. FIBROID AGAIN SEEN LUS/RIGHT ON TODAY'S EXAM. AMNIOTIC FLUID APPEARS NORMAL. AFI=17.0CM, EFW=3278g, 7 POUNDS 4 OZ, 95%, BPP 8/8 IN 10 MINUTES, BILATERAL ADNEXAS APPEAR UNREMARKABLE)  Prenatal Problems: allergy to shellfish anemia of pregnancy (Hgb 10.1 at 31 weeks, rx Fe.) asymptomatic bacteriuria in pregnancy (Rx ATB from NOB, check TOC after treatment.) maternal obesity complicating pregnancy, childbirth and the puerperium, antepartum (BMI 52--follow growth in 3rd trimester, antenatal testing from 32 weeks) pregnancy-induced hypertension (Dx in MAU on 09/28/20, induction at 37 weeks) uterine leiomyoma (X 3 noted at 11 weeks--largest left subserosal anterior, 5.1 x 3.9 x 6.8.) Vitamin D deficiency (RX Vit D 12/3  was 17.2)   Patient Active Problem List   Diagnosis Date Noted  . Fall 10/12/2020  . PIH (pregnancy induced hypertension), third trimester 10/07/2020  . Seasonal allergic rhinitis due to pollen 03/11/2019    (Not in a hospital admission)   Past  Medical History:  Diagnosis Date  . Anemia   . History of anemia      No current facility-administered medications on file prior to encounter.   Current Outpatient Medications on File Prior to Encounter  Medication Sig Dispense Refill  . aspirin 81 MG chewable tablet Chew by mouth daily.    . ferrous sulfate 325 (65 FE) MG tablet Take 325 mg by mouth daily with breakfast.    . Prenatal Vit-Fe Fumarate-FA (PRENATAL VITAMIN) 27-0.8 MG TABS Take 1 tablet by mouth daily. 30 tablet 2     Allergies  Allergen Reactions  . Shellfish Allergy Hives and Itching    History of present pregnancy: Pt Info/Preference:  Screening/Consents:  Labs:   EDD: Estimated Date of Delivery: 11/11/20  Establised: Patient's last menstrual period was 02/05/2020 (exact date).  Anatomy Scan: Date: 21.4 wg Placenta Location: posterior Genetic Screen: Panoroma:LR AFP: WNL First Tri: Quad: Horizon: Negative  Office: [facility]             First PNV: 11.3 wg Blood Type  A+  Language: ENglish Last PNV: 35.4 wg Rhogam  N/A  Flu Vaccine:  Declined   Antibody  Neg  TDaP vaccine Declined Received x2 doses covid vaccine   GTT: Early: 5.4 Third Trimester: 125  Feeding Plan: Breast/bottle BTL: no Rubella:  Immune  Contraception: ??? VBAC: no RPR:   NR  Circumcision: ???   HBsAg/Hep C:  Neg/Neg  Pediatrician:  ???   HIV:   NR  Prenatal Classes: no Additional [place]: Yes see HPI GBS:  Unknown(For PCN allergy, check sensitivities)       Chlamydia: Neg    MFM Referral/Consult:  GC: Neg  Support Person: Husband in ER   PAP: ???  Pain Management: IV/PO for now Neonatologist Referral:  Hgb Electrophoresis:  AA  Birth Plan: None for now   Hgb NOB: 11.5    28W: 10.1   OB History    Gravida  3   Para  2   Term  2   Preterm      AB      Living  3     SAB      TAB      Ectopic      Multiple  1   Live Births  3          Past Medical History:  Diagnosis Date  . Anemia   . History of anemia    Past Surgical  History:  Procedure Laterality Date  . 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 Social History:  reports that she has never smoked. She has never used smokeless tobacco. She reports that she does not drink alcohol and does not use drugs.   Prenatal Transfer Tool  Maternal Diabetes: No Genetic Screening: Normal Maternal Ultrasounds/Referrals: Normal Fetal Ultrasounds or other Referrals:  None Maternal Substance Abuse:  No Significant Maternal Medications:  None Significant Maternal Lab Results: Other: Unknown  ROS:  Review of Systems  Constitutional: Negative.   HENT: Negative.   Eyes: Negative.   Respiratory: Negative.  Cardiovascular: Negative.   Gastrointestinal: Negative.   Genitourinary: Negative.   Musculoskeletal: Negative.   Skin: Negative.        Repaired laceration superior to right eye  Neurological: Positive for loss of consciousness.       Per ED RN, was diaphoretic with decreased LOC  Endo/Heme/Allergies: Negative.   Psychiatric/Behavioral: Negative.      Physical Exam: BP (!) 142/83   Pulse 82   Temp (!) 97.5 F (36.4 C) (Oral)   Resp (!) 24   LMP 02/05/2020 (Exact Date)   SpO2 100%   Physical Exam Vitals and nursing note reviewed. Exam conducted with a chaperone present.  Constitutional:      Appearance: Normal appearance. She is obese.  HENT:     Head: Normocephalic.     Comments: Right approximate repaired with dermabond laceration, with swelling noted to right upper eyelid and brow.     Nose: Nose normal.     Mouth/Throat:     Mouth: Mucous membranes are moist.  Eyes:     Extraocular Movements: Extraocular movements intact.     Conjunctiva/sclera: Conjunctivae normal.     Pupils: Pupils are equal, round, and reactive to light.  Cardiovascular:     Rate and Rhythm: Normal  rate and regular rhythm.     Pulses: Normal pulses.     Heart sounds: Normal heart sounds.  Pulmonary:     Effort: Pulmonary effort is normal.     Breath sounds: Normal breath sounds.  Abdominal:     General: Bowel sounds are normal.     Palpations: Abdomen is soft.     Tenderness: There is no abdominal tenderness.  Genitourinary:    Comments: Deferred   Musculoskeletal:        General: Normal range of motion.     Cervical back: Normal range of motion and neck supple.  Skin:    General: Skin is warm.     Capillary Refill: Capillary refill takes less than 2 seconds.  Neurological:     General: No focal deficit present.     Mental Status: She is alert and oriented to person, place, and time. Mental status is at baseline.     Cranial Nerves: No cranial nerve deficit.     Comments: No clonus noted on PE.   Psychiatric:        Mood and Affect: Mood normal.      NST: FHR baseline 130 bpm, Variability: moderate, Accelerations:present, Decelerations:  Absent= Cat 1/Reactive UC:   UI SVE:  Deffered Leopold's: Position vertex, EFW 7lbs via leopold's.   Labs: Results for orders placed or performed during the hospital encounter of 10/12/20 (from the past 24 hour(s))  CBC     Status: Abnormal   Collection Time: 10/12/20  7:43 AM  Result Value Ref Range   WBC 7.4 4.0 - 10.5 K/uL   RBC 3.60 (L) 3.87 - 5.11 MIL/uL   Hemoglobin 10.1 (L) 12.0 - 15.0 g/dL   HCT 32.2 (L) 36 - 46 %   MCV 89.4 80.0 - 100.0 fL   MCH 28.1 26.0 - 34.0 pg   MCHC 31.4 30.0 - 36.0 g/dL   RDW 14.8 11.5 - 15.5 %   Platelets 317 150 - 400 K/uL   nRBC 0.0 0.0 - 0.2 %  I-stat chem 8, ED (not at [facility] or [facility])     Status: Abnormal   Collection Time: 10/12/20  8:12 AM  Result Value Ref Range   Sodium 137 135 -  145 mmol/L   Potassium 3.3 (L) 3.5 - 5.1 mmol/L   Chloride 104 98 - 111 mmol/L   BUN 4 (L) 6 - 20 mg/dL   Creatinine, Ser 0.70 0.44 - 1.00 mg/dL   Glucose, Bld 117 (H) 70 - 99 mg/dL   Calcium, Ion 1.11 (L)  1.15 - 1.40 mmol/L   TCO2 23 22 - 32 mmol/L   Hemoglobin 9.5 (L) 12.0 - 15.0 g/dL   HCT 28.0 (L) 36 - 46 %  Resp Panel by RT-PCR (Flu A&B, Covid) Nasopharyngeal Swab     Status: None   Collection Time: 10/12/20  8:18 AM   Specimen: Nasopharyngeal Swab; Nasopharyngeal(NP) swabs in vial transport medium  Result Value Ref Range   SARS Coronavirus 2 by RT PCR NEGATIVE NEGATIVE   Influenza A by PCR NEGATIVE NEGATIVE   Influenza B by PCR NEGATIVE NEGATIVE    Imaging:  CT Angio Chest PE W and/or Wo Contrast  Result Date: 10/12/2020 CLINICAL DATA:  Syncope.  Patient is [redacted] weeks pregnant. EXAM: CT ANGIOGRAPHY CHEST WITH CONTRAST TECHNIQUE: Multidetector CT imaging of the chest was performed using the standard protocol during bolus administration of intravenous contrast. Multiplanar CT image reconstructions and MIPs were obtained to evaluate the vascular anatomy. CONTRAST:  36mL OMNIPAQUE IOHEXOL 350 MG/ML SOLN COMPARISON:  None. FINDINGS: Cardiovascular: Satisfactory opacification of the pulmonary arteries to the segmental level. No evidence of pulmonary embolism. Thoracic aorta is normal in course and caliber. Normal heart size. Trace pericardial effusion. Mediastinum/Nodes: No enlarged mediastinal, hilar, or axillary lymph nodes. Thyroid gland, trachea, and esophagus demonstrate no significant findings. Lungs/Pleura: Mild dependent bibasilar subsegmental atelectasis. Lungs are otherwise clear. No pleural effusion or pneumothorax. Upper Abdomen: Small hiatal hernia. Musculoskeletal: No chest wall abnormality. No acute or significant osseous findings. Review of the MIP images confirms the above findings. IMPRESSION: 1. No evidence of pulmonary embolism or other acute intrathoracic process. 2. Trace pericardial effusion. 3. Small hiatal hernia. Electronically Signed   By: Davina Poke D.O.   On: 10/12/2020 09:40    MAU Course: Orders Placed This Encounter  Procedures  . LACERATION REPAIR  . Resp  Panel by RT-PCR (Flu A&B, Covid) Nasopharyngeal Swab  . CT Angio Chest PE W and/or Wo Contrast  . CT HEAD WO CONTRAST  . CBC  . Comprehensive metabolic panel  . Protein / creatinine ratio, urine  . Diet regular Room service appropriate? Yes; Fluid consistency: Thin  . Dermabond  . Notify physician (specify)  . Vital signs  . Defer vaginal exam for vaginal bleeding or PROM <37 weeks  . Initiate Oral Care Protocol  . Initiate Carrier Fluid Protocol  . SCDs  . Continuous tocometry  . Fetal monitoring  . Bed rest with bathroom privileges  . Full code  . I-stat chem 8, ED (not at [facility] or [facility])  . EKG 12-Lead  . EKG 12-Lead  . Type and screen [place]  . Place in observation (patient's expected length of stay will be less than 2 midnights)   Meds ordered this encounter  Medications  . FOLLOWED BY Linked Order Group   . sodium chloride 0.9 % bolus 1,000 mL   . 0.9 %  sodium chloride infusion  . iohexol (OMNIPAQUE) 350 MG/ML injection 75 mL  . acetaminophen (TYLENOL) tablet 650 mg  . zolpidem (AMBIEN) tablet 5 mg  . docusate sodium (COLACE) capsule 100 mg  . calcium carbonate (TUMS - dosed in mg elemental calcium) chewable tablet 400 mg  of elemental calcium  . prenatal multivitamin tablet 1 tablet  . lactated ringers infusion    Assessment/Plan: Lydia Mendoza is a 34 y.o. female, G3P2003, IUP at 35.5 weeks, presenting for near syncopal episode with episodic altered mental status changes. Pt came from the ED.   ED HPI:  Patient presented to the ED for evaluation after a syncopal episode.  Patient was actually here in the emergency room accompanying her husband who is being evaluated/patient unfortunately had been in the ED since yesterday afternoon.  Last time she ate anything was yesterday afternoon.  Patient was sitting at the bedside with her husband when she started to feel lightheaded and dizzy.  Patient felt like she had to get something to drink.  She was  walking out to the nursing station to get something to drink when she had a witnessed syncopal episode.  Patient fell and hit her head against the counter.  She did sustain a small laceration above her right eyelid.  Patient did regain consciousness quickly but she was noted to be diaphoretic.  Patient was assisted to a gurney.  She states she still does feel thirsty.  She is not having any abdominal pain.  She has not noticed any vaginal bleeding.  She is not having any fevers or chills.  No chest pain or shortness of breath.  Pt currently 12/8 @ 0945 returned from CTA scan which resulted: 1. No evidence of pulmonary embolism or other acute intrathoracic process. 2. Trace pericardial effusion. 3. Small hiatal hernia. Unremarkable EKG, troponin's, Potassium slight low at 3.3, covid and influenza test negative, glucose 117. Pt endorses feeling much better after having a snack, sleep and IV fluids. Pt appears stable, unremarkable neuro exam, under the impression her near syncopal episode were caused from vaso vagal/otherstaic hypotension due to dehydration, sleep depravation. Pt verbalized to me that she lost her balance and her depth perception was off, pt went to nursing station counter for water and went to lean her arm against the counter and missed which then made her hit her head on the counter and rolled, landed on her back, denies abdomen being hit, pt denies losing conscious, she endorses reraining a walk  During the episode, RN reported pt later fell asleep and was hard to wake her, she was not responsive to touch, had x2 episodes of becoming agitated and diaphoretic, decreased o2 stats during sleeping with denials of a h/o sleep apneia  GHTN: Pt was dx with [facility] with GHTN, PCR pending today, 0.10 (5 days ago), 0.21 (2 weeks ago)  , creatinine 0.7, HGB 10.1, platelets 317, denies HA, RUQ pain or vision changes, denies CP, SOB, abdominal pain, lower extremities deficits. Pt endorses feeling fine now with  full strength in all extremities, denies pain except on superior right eye from lacerations. Endorses +FM, denies vaginal bleeding, cramps, pain or leakage of fluids. Report last [place] was yesterday (SINGLETON PREGNANCY. VERTEX PRESENTATION. POSTERIOR PLACENTA. CERVIX APPEARS CLOSED AND MEASURES 4.9CM TRANSABDOMINALLY. FIBROID AGAIN SEEN LUS/RIGHT ON TODAY'S EXAM. AMNIOTIC FLUID APPEARS NORMAL. AFI=17.0CM, EFW=3278g, 7 POUNDS 4 OZ, 95%, BPP 8/8 IN 10 MINUTES, BILATERAL ADNEXAS APPEAR UNREMARKABLE)  Prenatal Problems: allergy to shellfish anemia of pregnancy (Hgb 10.1 at 31 weeks, rx Fe.) asymptomatic bacteriuria in pregnancy (Rx ATB from NOB, check TOC after treatment.) maternal obesity complicating pregnancy, childbirth and the puerperium, antepartum (BMI 52--follow growth in 3rd trimester, antenatal testing from 32 weeks) pregnancy-induced hypertension (Dx in MAU on 09/28/20, induction at 37 weeks) uterine leiomyoma (X  3 noted at 11 weeks--largest left subserosal anterior, 5.1 x 3.9 x 6.8.) Vitamin D deficiency (RX Vit D 12/3 was 17.2)   FWB: Cat 1 Fetal Tracing.   Plan: Admit to Antepartum per consult with DR Dillard 24 Hour Observation from fall and near syncopal episodes. Bedrest LR 122ml/hr Regular Diet SCD Head CT Plan for IOL for GHTN at 37 weeks.    [facility] NP-C, CNM, MSN 10/12/2020, 10:07 AM   Spent 30 mins face to face with more than 50% of educations done.

## 2020-10-12 NOTE — ED Notes (Signed)
Pt was here with husband in trauma A all night and came to charge desk to ask for something to drink.  As she got to desk she had a syncopal episode and fell hitting her head against the counter.  Pt alert and oriented.  Small lac above R eyelid.  Diaphoretic.  Dr. Tomi Bamberger to desk to assess pt.  She denies dizziness.  Blood sugar 119.  Pt is 8 months pregnant.  Pt placed on stretcher and moved to treatment room.  Notified husband of event.

## 2020-10-12 NOTE — Progress Notes (Signed)
Dr Charlesetta Garibaldi notified on pts bp and O2 sats dropping.  MD wants to discuss ruling out PE with ED physician.

## 2020-10-12 NOTE — ED Notes (Signed)
Transported to CT with Paticia Stack RN, from CT will be taken to womens tower for admission.

## 2020-10-12 NOTE — ED Notes (Signed)
Erin RN - RROB - at bedside - Pt placed on TOCO by this RN, adjusted by RROB RN.

## 2020-10-12 NOTE — Plan of Care (Signed)
  Problem: Education: Goal: Knowledge of General Education information will improve Description: Including pain rating scale, medication(s)/side effects and non-pharmacologic comfort measures Outcome: Completed/Met

## 2020-10-13 DIAGNOSIS — O1490 Unspecified pre-eclampsia, unspecified trimester: Secondary | ICD-10-CM | POA: Diagnosis present

## 2020-10-13 DIAGNOSIS — W1809XA Striking against other object with subsequent fall, initial encounter: Secondary | ICD-10-CM | POA: Diagnosis present

## 2020-10-13 DIAGNOSIS — D252 Subserosal leiomyoma of uterus: Secondary | ICD-10-CM | POA: Diagnosis present

## 2020-10-13 DIAGNOSIS — Y92238 Other place in hospital as the place of occurrence of the external cause: Secondary | ICD-10-CM | POA: Diagnosis present

## 2020-10-13 DIAGNOSIS — R55 Syncope and collapse: Secondary | ICD-10-CM | POA: Diagnosis present

## 2020-10-13 DIAGNOSIS — E669 Obesity, unspecified: Secondary | ICD-10-CM | POA: Diagnosis present

## 2020-10-13 DIAGNOSIS — Z20822 Contact with and (suspected) exposure to covid-19: Secondary | ICD-10-CM | POA: Diagnosis present

## 2020-10-13 DIAGNOSIS — O1493 Unspecified pre-eclampsia, third trimester: Secondary | ICD-10-CM | POA: Diagnosis present

## 2020-10-13 DIAGNOSIS — O99013 Anemia complicating pregnancy, third trimester: Secondary | ICD-10-CM | POA: Diagnosis present

## 2020-10-13 DIAGNOSIS — S01111A Laceration without foreign body of right eyelid and periocular area, initial encounter: Secondary | ICD-10-CM | POA: Diagnosis present

## 2020-10-13 DIAGNOSIS — Z3A35 35 weeks gestation of pregnancy: Secondary | ICD-10-CM | POA: Diagnosis not present

## 2020-10-13 DIAGNOSIS — O3413 Maternal care for benign tumor of corpus uteri, third trimester: Secondary | ICD-10-CM | POA: Diagnosis present

## 2020-10-13 DIAGNOSIS — O99213 Obesity complicating pregnancy, third trimester: Secondary | ICD-10-CM | POA: Diagnosis present

## 2020-10-13 DIAGNOSIS — O99891 Other specified diseases and conditions complicating pregnancy: Secondary | ICD-10-CM | POA: Diagnosis not present

## 2020-10-13 DIAGNOSIS — D649 Anemia, unspecified: Secondary | ICD-10-CM | POA: Diagnosis present

## 2020-10-13 LAB — COMPREHENSIVE METABOLIC PANEL
ALT: 16 U/L (ref 0–44)
AST: 20 U/L (ref 15–41)
Albumin: 2.3 g/dL — ABNORMAL LOW (ref 3.5–5.0)
Alkaline Phosphatase: 109 U/L (ref 38–126)
Anion gap: 10 (ref 5–15)
BUN: 7 mg/dL (ref 6–20)
CO2: 21 mmol/L — ABNORMAL LOW (ref 22–32)
Calcium: 8.5 mg/dL — ABNORMAL LOW (ref 8.9–10.3)
Chloride: 104 mmol/L (ref 98–111)
Creatinine, Ser: 0.64 mg/dL (ref 0.44–1.00)
GFR, Estimated: >60 mL/min (ref >60)
Glucose, Bld: 94 mg/dL (ref 70–99)
Potassium: 3.6 mmol/L (ref 3.5–5.1)
Sodium: 135 mmol/L (ref 135–145)
Total Bilirubin: 0.4 mg/dL (ref 0.3–1.2)
Total Protein: 6.2 g/dL — ABNORMAL LOW (ref 6.5–8.1)

## 2020-10-13 LAB — CBC
HCT: 29.8 % — ABNORMAL LOW (ref 36.0–46.0)
Hemoglobin: 9.3 g/dL — ABNORMAL LOW (ref 12.0–15.0)
MCH: 27.3 pg (ref 26.0–34.0)
MCHC: 31.2 g/dL (ref 30.0–36.0)
MCV: 87.4 fL (ref 80.0–100.0)
Platelets: 310 10*3/uL (ref 150–400)
RBC: 3.41 MIL/uL — ABNORMAL LOW (ref 3.87–5.11)
RDW: 14.8 % (ref 11.5–15.5)
WBC: 6 10*3/uL (ref 4.0–10.5)
nRBC: 0 % (ref 0.0–0.2)

## 2020-10-13 LAB — SARS-COV-2, NAA 2 DAY TAT

## 2020-10-13 LAB — NOVEL CORONAVIRUS, NAA: SARS-CoV-2, NAA: NOT DETECTED

## 2020-10-13 LAB — URIC ACID: Uric Acid, Serum: 3.9 mg/dL (ref 2.5–7.1)

## 2020-10-13 MED ORDER — BETAMETHASONE SOD PHOS & ACET 6 (3-3) MG/ML IJ SUSP
12.0000 mg | INTRAMUSCULAR | Status: AC
  Administered 2020-10-13 – 2020-10-14 (×2): 12 mg via INTRAMUSCULAR
  Filled 2020-10-13: qty 5

## 2020-10-13 NOTE — Progress Notes (Addendum)
Hospital Day 2 S/P fall Pre-e wwithout severe features  Subjective:    Doing well, denies HA, visual changes, and epigastric pain. Denies dizziness/lightheadedness. Perceives active fetal movement and denies ctx. Ambulating independently to BR and caring for self w/o assistance. Laceration above right eye closed w/ glue and steristrips. Discussed POC, pt agrees to antenatal steroids and in-pt management overnight to receive second dose. Pt request to DC continuous EFM. Spouse present and doing well, supportive of pt, and asking questions. Per pt, Dr. Gertie Exon of MFM saw pt this AM.   Objective:    VS: BP (!) 146/92 (BP Location: Left Arm)   Pulse 93   Temp 97.6 F (36.4 C) (Oral)   Resp 18   Ht 5\' 8"  (1.727 m)   Wt (!) 153.2 kg   LMP 02/05/2020 (Exact Date)   SpO2 97%   BMI 51.35 kg/m  FHR : baseline 135 / variability moderate / accelerations present / absent decelerations Toco: none Membranes: intact   Physical Exam Eyes:     Extraocular Movements: Extraocular movements intact.  Cardiovascular:     Rate and Rhythm: Normal rate and regular rhythm.  Pulmonary:     Effort: Pulmonary effort is normal.  Neurological:     Mental Status: She is alert and oriented to person, place, and time.  Skin:    General: Skin is warm and dry.  Psychiatric:        Mood and Affect: Mood normal.  Vitals and nursing note reviewed.    CBC    Component Value Date/Time   WBC 6.0 10/13/2020 0803   RBC 3.41 (L) 10/13/2020 0803   HGB 9.3 (L) 10/13/2020 0803   HCT 29.8 (L) 10/13/2020 0803   PLT 310 10/13/2020 0803   MCV 87.4 10/13/2020 0803   MCH 27.3 10/13/2020 0803   MCHC 31.2 10/13/2020 0803   RDW 14.8 10/13/2020 0803   CMP     Component Value Date/Time   NA 135 10/13/2020 0803   K 3.6 10/13/2020 0803   CL 104 10/13/2020 0803   CO2 21 (L) 10/13/2020 0803   GLUCOSE 94 10/13/2020 0803   BUN 7 10/13/2020 0803   CREATININE 0.64 10/13/2020 0803   CALCIUM 8.5 (L) 10/13/2020 0803   PROT  6.2 (L) 10/13/2020 0803   ALBUMIN 2.3 (L) 10/13/2020 0803   AST 20 10/13/2020 0803   ALT 16 10/13/2020 0803   ALKPHOS 109 10/13/2020 0803   BILITOT 0.4 10/13/2020 0803   GFRNONAA >60 10/13/2020 0803   Protein creatinine ratio 0.62  Assessment/Plan:   34 y.o. G3P2003 [redacted]w[redacted]d Hosp Day 2 S/P fall Pre-e w/o severe features    -mild range BP    -neg neuro S/S Cat 1 FHR surveillance Antenatal steroids Out-pt management w/ twice wkly NST Discharge 10/14/20 IOL @ 37 wks  Plan developed w/ Dr. Etheleen Nicks MSN, CNM 10/13/2020 9:40 AM

## 2020-10-13 NOTE — Progress Notes (Signed)
GBS collected in office on 10/11/20, results pending.   Burman Foster, MSN, CNM 10/13/2020 6:05 PM

## 2020-10-13 NOTE — Consult Note (Signed)
MFM Consultation  Requesting Provider:Jade [place], [place], Dr. Charlesetta Garibaldi. Date of Service: 10/13/20 Reason for request: Maternal Syncope with new proteinuria  [name] is a 34 yo G3P2 at [employer] is admitted to the hospital for new syncopal episode. She is seen at the request of [facility] FNP.  Lydia Mendoza was evaluated in the ED for new onset syncopal episode while caring for her husband in the hospital. She reports that she is feeling well today. However, per reports Lydia Mendoza had not eaten or hydrated for nearly 24 hours and at the time of walking she became light headed and fell lacerating the right side of her head. Her work up was negative for neurologic, cardiac, pulmonary embolism, and endocrine. She was mildly hypotensive and diaphoretic at the time of this event.   She is doing well this morning sitting up in bed eating breakfast. She says she fills much better after receiving fluids.  [name] pregnancy is complicated by gestational hypertension. She has had a negative work up thus far with normal CBC, CMP and UPC until today. She had an elevated UPC of 0.62 suggesting a new diagnosis of preeclampsia without features. Her blood pressure remains < 160/110.primarily in the 140-150's/80--90's  Vitals with BMI 10/13/2020 10/13/2020 10/13/2020  Height - - -  Weight - - -  BMI - - -  Systolic 628 315 176  Diastolic 86 93 92  Pulse 92 83 93   CBC Latest Ref Rng & Units 10/13/2020 10/12/2020 10/12/2020  WBC 4.0 - 10.5 K/uL 6.0 - 7.4  Hemoglobin 12.0 - 15.0 g/dL 9.3(L) 9.5(L) 10.1(L)  Hematocrit 36.0 - 46.0 % 29.8(L) 28.0(L) 32.2(L)  Platelets 150 - 400 K/uL 310 - 317   CMP Latest Ref Rng & Units 10/13/2020 10/12/2020 10/12/2020  Glucose 70 - 99 mg/dL 94 117(H) 113(H)  BUN 6 - 20 mg/dL 7 4(L) 5(L)  Creatinine 0.44 - 1.00 mg/dL 0.64 0.70 0.65  Sodium 135 - 145 mmol/L 135 137 136  Potassium 3.5 - 5.1 mmol/L 3.6 3.3(L) 3.3(L)  Chloride 98 - 111 mmol/L 104 104 104  CO2 22 -  32 mmol/L 21(L) - 21(L)  Calcium 8.9 - 10.3 mg/dL 8.5(L) - 8.6(L)  Total Protein 6.5 - 8.1 g/dL 6.2(L) - 6.1(L)  Total Bilirubin 0.3 - 1.2 mg/dL 0.4 - 0.3  Alkaline Phos 38 - 126 U/L 109 - 118  AST 15 - 41 U/L 20 - 19  ALT 0 - 44 U/L 16 - 15   OB History  Gravida Para Term Preterm AB Living  3 2 2  0 0 3  SAB IAB Ectopic Multiple Live Births  0 0 0 1 3    # Outcome Date GA Lbr Len/2nd Weight Sex Delivery Anes PTL Lv  3 Current           2A Term 05/01/11     Vag-Spont   LIV  2B Term 05/01/11     Vag-Spont   LIV  1 Term 11/23/05    F Vag-Spont   LIV   Past Medical History:  Diagnosis Date  . Anemia   . History of anemia    Past Surgical History:  Procedure Laterality Date  . 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 Depression Mother   . Breast cancer Maternal Grandmother   . Hypertension Maternal Grandmother   . Heart disease Neg Hx    Social History  Socioeconomic History  . Marital status: Single    Spouse name: Not on file  . Number of children: Not on file  . Years of education: Not on file  . Highest education level: Not on file  Occupational History  . Not on file  Tobacco Use  . Smoking status: Never Smoker  . Smokeless tobacco: Never Used  Vaping Use  . Vaping Use: Never used  Substance and Sexual Activity  . Alcohol use: No  . Drug use: No  . Sexual activity: Yes    Birth control/protection: None  Other Topics Concern  . Not on file  Social History Narrative  . Not on file   Social Determinants of Health   Financial Resource Strain: Not on file  Food Insecurity: Not on file  Transportation Needs: Not on file  Physical Activity: Not on file  Stress: Not on file  Social Connections: Not on file  Intimate Partner Violence: Not on file    Current Facility-Administered Medications (Endocrine & Metabolic):  .  betamethasone acetate-betamethasone sodium phosphate (CELESTONE)  injection 12 mg       Current Facility-Administered Medications (Analgesics):  .  acetaminophen (TYLENOL) tablet 650 mg     Current Facility-Administered Medications (Other):  .  calcium carbonate (TUMS - dosed in mg elemental calcium) chewable tablet 400 mg of elemental calcium .  cholecalciferol (VITAMIN D3) tablet 4,000 Units .  docusate sodium (COLACE) capsule 100 mg .  prenatal multivitamin tablet 1 tablet .  sodium chloride flush (NS) 0.9 % injection 3 mL .  zolpidem (AMBIEN) tablet 5 mg  No current outpatient medications on file.   Imaging not performed at this visit.  Impression/Counseling:   I discussed with Lydia Mendoza the likelihood of her syncopal episode was due to dehydration and a vasovagal in pregnancy. She overall is doing well with a negative evaluation thus far.  Secondly, we discussed the new diagnosis of preeclampsia without severe features. We discussed the diagnosis was due to the new onset proteinuria. The risk to the pregnancy are similar including fetal growth restriction, HELLP, severe feature, stillbirth, placental abruption and maternal stroke if worsens.  At this time we recommend 2x weekly testing weekly labs and plan for delivery at 37 weeks. She may be managed as an outpatient as long as she is without symptoms. If symptoms or abnormal labs develop deliver. May consider late preterm betamethasone.  Lydia Mendoza expressed and understanding of this plan and is agreement.  I spent 45 minute with > 50% in face to face consultation.  I discussed the plan of care with Dr. Mancel Bale as well.  Oisin Yoakum Rolla Etienne.

## 2020-10-14 ENCOUNTER — Other Ambulatory Visit: Payer: Self-pay | Admitting: Obstetrics and Gynecology

## 2020-10-14 NOTE — Progress Notes (Signed)
Reviewed discharge antepartum instructions with patient regarding medications, when to call MD/go to MAU, kick counts, signs of labor, signs and symptoms of pre-e, and to follow up with OB on Monday. Pt asked appropriate questions and verbalized understanding of discharge instructions.

## 2020-10-14 NOTE — Discharge Summary (Signed)
Antenatal Discharge Summary     Patient Name: Lydia Mendoza DOB: 09/15/1986 MRN: [id number]  Date of admission: 10/12/2020 Delivering MD: This patient has no babies on file.  Admitting diagnosis: Fall [W19.XXXA] Facial laceration, initial encounter [S01.81XA] Syncope, unspecified syncope type [R55] Pre-eclampsia [O14.90] Intrauterine pregnancy: [redacted]w[redacted]d     Secondary diagnosis:  Active Problems:   Fall   Preeclampsia   Facial laceration   Near syncope   Vitamin D deficiency   Pre-eclampsia                                History of Present Illness: [name] is a 34 y.o. female, G3P2003, who presents at [redacted]w[redacted]d weeks gestation. The patient has been followed at  [facility]  Her pregnancy has been complicated by:  Patient Active Problem List   Diagnosis Date Noted  . Pre-eclampsia 10/13/2020  . Fall 10/12/2020  . Preeclampsia 10/12/2020  . Facial laceration 10/12/2020  . Near syncope 10/12/2020  . Vitamin D deficiency 10/12/2020  . PIH (pregnancy induced hypertension), third trimester 10/07/2020  . Seasonal allergic rhinitis due to pollen 03/11/2019    Hospital course:   Lydia Mendoza is a 34 y.o. female, G3P2003, IUP at 35.5 weeks, presenting for near syncopal episode with episodic altered mental status changes. Pt came from the ED.   ED HPI: Patient presented to the ED for evaluation after a syncopal episode. Patient was actually here in the emergency room accompanying her husband who is being evaluated/patient unfortunately had been in the ED since yesterday afternoon. Last time she ate anything was yesterday afternoon. Patient was sitting at the bedside with her husband when she started to feel lightheaded and dizzy. Patient felt like she had to get something to drink. She was walking out to the nursing station to get something to drink when she had a witnessed syncopal episode. Patient fell and hit her head against the  counter. She did sustain a small laceration above her right eyelid. Patient did regain consciousness quickly but she was noted to be diaphoretic. Patient was assisted to a gurney. She states she still does feel thirsty. She is not having any abdominal pain. She has not noticed any vaginal bleeding. She is not having any fevers or chills. No chest pain or shortness of breath.  Pt currently 12/8 @ 0945 returned from CTA scan which resulted: 1. No evidence of pulmonary embolism or other acute intrathoracic process. 2. Trace pericardial effusion. 3. Small hiatal hernia. Unremarkable EKG, troponin's, Potassium slight low at 3.3, covid and influenza test negative, glucose 117. Pt endorses feeling much better after having a snack, sleep and IV fluids. Pt appears stable, unremarkable neuro exam, under the impression her near syncopal episode were caused from vaso vagal/otherstaic hypotension due to dehydration, sleep depravation. Pt verbalized to me that she lost her balance and her depth perception was off, pt went to nursing station counter for water and went to lean her arm against the counter and missed which then made her hit her head on the counter and rolled, landed on her back, denies abdomen being hit, pt denies losing conscious, she endorses reraining a walk  During the episode, RN reported pt later fell asleep and was hard to wake her, she was not responsive to touch, had x2 episodes of becoming agitated and diaphoretic, decreased o2 stats during sleeping with denials of a h/o sleep apneia  GHTN:  Pt was dx with [facility] with GHTN, PCR pending today, 0.10 (5 days ago), 0.21 (2 weeks ago)  , creatinine 0.7, HGB 10.1, platelets 317, denies HA, RUQ pain or vision changes, denies CP, SOB, abdominal pain, lower extremities deficits. Pt endorses feeling fine now with full strength in all extremities, denies pain except on superior right eye from lacerations. Endorses +FM, denies vaginal bleeding, cramps, pain  or leakage of fluids. Report last [place] was yesterday (SINGLETON PREGNANCY. VERTEX PRESENTATION. POSTERIOR PLACENTA. CERVIX APPEARS CLOSED AND MEASURES 4.9CM TRANSABDOMINALLY. FIBROID AGAIN SEEN LUS/RIGHT ON TODAY'S EXAM. AMNIOTIC FLUID APPEARS NORMAL. AFI=17.0CM, EFW=3278g, 7 POUNDS 4 OZ, 95%, BPP 8/8 IN 10 MINUTES, BILATERAL ADNEXAS APPEAR UNREMARKABLE)  12/09 MFM: At this time we recommend 2x weekly testing weekly labs and plan for delivery at 37 weeks. She may be managed as an outpatient as long as she is without symptoms. If symptoms or abnormal labs develop deliver. Given BMZ.   Subjective note: Doing well, denies HA, visual changes, and epigastric pain. Denies dizziness/lightheadedness. Perceives active fetal movement and denies ctx. Ambulating independently to BR and caring for self w/o assistance. Laceration above right eye closed w/ glue and steristrips. Discussed POC, pt agrees to antenatal steroids and in-pt management overnight to receive second dose. Pt request to DC continuous EFM. Spouse present and doing well, supportive of pt, and asking questions. Per pt, BMZ given x1 dose.  12/10: Today pt is stable, denies HA, RUQ pain or vision changes, +FM. Denies vaginal leakage or bleeding. No meds, BP stable at 133/80 and 149/86, educated about preE s/sx reporting and appointments. Discharged to home in stable condition with close f/u. Pt denies any further near syncopal or syncopal episode, pt is stable. Pt received 2nd dose BMZ today. GBS pending in office.     Physical exam  Vitals:   10/13/20 2349 10/14/20 0454 10/14/20 0750 10/14/20 1133  BP: (!) 148/84 138/85 133/80 (!) 149/86  Pulse: 98 86 92 93  Resp: 18 18 17 18   Temp: 97.6 F (36.4 C) 98 F (36.7 C) 97.9 F (36.6 C) 97.6 F (36.4 C)  TempSrc: Oral Oral Oral Oral  SpO2: 96% 95% 98% 99%  Weight:      Height:       General: alert, cooperative and no distress Eyes:     Extraocular Movements: Extraocular movements intact.   Cardiovascular:     Rate and Rhythm: Normal rate and regular rhythm.  Pulmonary:     Effort: Pulmonary effort is normal.  Neurological:     Mental Status: She is alert and oriented to person, place, and time.  Skin:    General: Skin is warm and dry.  Psychiatric:        Mood and Affect: Mood normal.  Vitals and nursing note reviewed.  Uterine Fundus: firm Incision: Healing well with no significant drainage, No significant erythema, Dressing is clean, dry, and intact, honeycomb dressing CDI Perineum: Intact DVT Evaluation: No evidence of DVT seen on physical exam. Negative Homan's sign. No cords or calf tenderness. No significant calf/ankle edema.  Labs: Lab Results  Component Value Date   WBC 6.0 10/13/2020   HGB 9.3 (L) 10/13/2020   HCT 29.8 (L) 10/13/2020   MCV 87.4 10/13/2020   PLT 310 10/13/2020   CMP Latest Ref Rng & Units 10/13/2020  Glucose 70 - 99 mg/dL 94  BUN 6 - 20 mg/dL 7  Creatinine 0.44 - 1.00 mg/dL 0.64  Sodium 135 - 145 mmol/L 135  Potassium 3.5 -  5.1 mmol/L 3.6  Chloride 98 - 111 mmol/L 104  CO2 22 - 32 mmol/L 21(L)  Calcium 8.9 - 10.3 mg/dL 8.5(L)  Total Protein 6.5 - 8.1 g/dL 6.2(L)  Total Bilirubin 0.3 - 1.2 mg/dL 0.4  Alkaline Phos 38 - 126 U/L 109  AST 15 - 41 U/L 20  ALT 0 - 44 U/L 16    Date of discharge: 10/14/2020 Discharge Diagnoses: Preelampsia and Near syncopol  Discharge instruction: per After Visit Summary and "Baby and Me Booklet".  After visit meds:   Activity:           unrestricted and pelvic rest Advance as tolerated. Pelvic rest for 6 weeks.  Diet:                routine Medications: None Postpartum contraception: Undecided Condition:  Pt discharge to home in stable and condition  PreE: Close out pt f/u, see next appointment in appointment reminder, report s/sx of preE.  Anemia: Continue iron.  Vit D: continue vit D at home.   Meds: Allergies as of 10/14/2020      Reactions   Shellfish Allergy Hives, Itching       Medication List    TAKE these medications   aspirin 81 MG chewable tablet Chew by mouth daily.   ferrous sulfate 325 (65 FE) MG tablet Take 325 mg by mouth daily with breakfast.   Prenatal Vitamin 27-0.8 MG Tabs Take 1 tablet by mouth daily.       Discharge Follow Up:   Follow-up [facility] Follow up.   Specialty: Obstetrics and Gynecology Why: 12/13-12/14 NST/ROB/Labs  2X weeksly Labs for PreE monitoring 2x weekly NST IOL set for 10/21/2020 Contact information: [place]. [address] [place] [id number] [place], NP-C, CNM 10/14/2020, 1:45 PM  Noralyn Pick, [place]

## 2020-10-15 ENCOUNTER — Other Ambulatory Visit: Payer: Self-pay

## 2020-10-15 ENCOUNTER — Inpatient Hospital Stay (HOSPITAL_COMMUNITY)
Admission: AD | Admit: 2020-10-15 | Discharge: 2020-10-15 | Disposition: A | Discharge status: Home / Self Care | Payer: Medicaid Other | Attending: Obstetrics and Gynecology | Admitting: Obstetrics and Gynecology

## 2020-10-15 ENCOUNTER — Encounter (HOSPITAL_COMMUNITY): Payer: Self-pay | Admitting: Obstetrics and Gynecology

## 2020-10-15 DIAGNOSIS — O133 Gestational [pregnancy-induced] hypertension without significant proteinuria, third trimester: Secondary | ICD-10-CM | POA: Diagnosis not present

## 2020-10-15 DIAGNOSIS — Z3A36 36 weeks gestation of pregnancy: Secondary | ICD-10-CM | POA: Diagnosis not present

## 2020-10-15 DIAGNOSIS — Z3689 Encounter for other specified antenatal screening: Secondary | ICD-10-CM

## 2020-10-15 LAB — COMPREHENSIVE METABOLIC PANEL
ALT: 20 U/L (ref 0–44)
AST: 22 U/L (ref 15–41)
Albumin: 2.5 g/dL — ABNORMAL LOW (ref 3.5–5.0)
Alkaline Phosphatase: 112 U/L (ref 38–126)
Anion gap: 10 (ref 5–15)
BUN: 6 mg/dL (ref 6–20)
CO2: 22 mmol/L (ref 22–32)
Calcium: 9.7 mg/dL (ref 8.9–10.3)
Chloride: 104 mmol/L (ref 98–111)
Creatinine, Ser: 0.65 mg/dL (ref 0.44–1.00)
GFR, Estimated: >60 mL/min (ref >60)
Glucose, Bld: 111 mg/dL — ABNORMAL HIGH (ref 70–99)
Potassium: 3.9 mmol/L (ref 3.5–5.1)
Sodium: 136 mmol/L (ref 135–145)
Total Bilirubin: 0.3 mg/dL (ref 0.3–1.2)
Total Protein: 6.5 g/dL (ref 6.5–8.1)

## 2020-10-15 LAB — URINALYSIS, ROUTINE W REFLEX MICROSCOPIC
Bilirubin Urine: NEGATIVE
Glucose, UA: NEGATIVE mg/dL
Hgb urine dipstick: NEGATIVE
Ketones, ur: NEGATIVE mg/dL
Leukocytes,Ua: NEGATIVE
Nitrite: NEGATIVE
Protein, ur: NEGATIVE mg/dL
Specific Gravity, Urine: 1.013 (ref 1.005–1.030)
pH: 6 (ref 5.0–8.0)

## 2020-10-15 LAB — CBC
HCT: 29.9 % — ABNORMAL LOW (ref 36.0–46.0)
Hemoglobin: 9.8 g/dL — ABNORMAL LOW (ref 12.0–15.0)
MCH: 28.2 pg (ref 26.0–34.0)
MCHC: 32.8 g/dL (ref 30.0–36.0)
MCV: 85.9 fL (ref 80.0–100.0)
Platelets: 334 10*3/uL (ref 150–400)
RBC: 3.48 MIL/uL — ABNORMAL LOW (ref 3.87–5.11)
RDW: 14.7 % (ref 11.5–15.5)
WBC: 9.1 10*3/uL (ref 4.0–10.5)
nRBC: 0 % (ref 0.0–0.2)

## 2020-10-15 LAB — PROTEIN / CREATININE RATIO, URINE
Creatinine, Urine: 128.6 mg/dL
Protein Creatinine Ratio: 0.19 mg/mg{Cre} — ABNORMAL HIGH (ref 0.00–0.15)
Total Protein, Urine: 25 mg/dL

## 2020-10-15 NOTE — Discharge Instructions (Signed)
Preeclampsia and Eclampsia Preeclampsia is a serious condition that may develop during pregnancy. This condition causes high blood pressure and increased protein in your urine along with other symptoms, such as headaches and vision changes. These symptoms may develop as the condition gets worse. Preeclampsia may occur at 20 weeks of pregnancy or later. Diagnosing and treating preeclampsia early is very important. If not treated early, it can cause serious problems for you and your baby. One problem it can lead to is eclampsia. Eclampsia is a condition that causes muscle jerking or shaking (convulsions or seizures) and other serious problems for the mother. During pregnancy, delivering your baby may be the best treatment for preeclampsia or eclampsia. For most women, preeclampsia and eclampsia symptoms go away after giving birth. In rare cases, a woman may develop preeclampsia after giving birth (postpartum preeclampsia). This usually occurs within 48 hours after childbirth but may occur up to 6 weeks after giving birth. What are the causes? The cause of preeclampsia is not known. What increases the risk? The following risk factors make you more likely to develop preeclampsia:  Being pregnant for the first time.  Having had preeclampsia during a past pregnancy.  Having a family history of preeclampsia.  Having high blood pressure.  Being pregnant with more than one baby.  Being 35 or older.  Being African-American.  Having kidney disease or diabetes.  Having medical conditions such as lupus or blood diseases.  Being very overweight (obese). What are the signs or symptoms? The most common symptoms are:  Severe headaches.  Vision problems, such as blurred or double vision.  Abdominal pain, especially upper abdominal pain. Other symptoms that may develop as the condition gets worse include:  Sudden weight gain.  Sudden swelling of the hands, face, legs, and feet.  Severe nausea  and vomiting.  Numbness in the face, arms, legs, and feet.  Dizziness.  Urinating less than usual.  Slurred speech.  Convulsions or seizures. How is this diagnosed? There are no screening tests for preeclampsia. Your health care provider will ask you about symptoms and check for signs of preeclampsia during your prenatal visits. You may also have tests that include:  Checking your blood pressure.  Urine tests to check for protein. Your health care provider will check for this at every prenatal visit.  Blood tests.  Monitoring your baby's heart rate.  Ultrasound. How is this treated? You and your health care provider will determine the treatment approach that is best for you. Treatment may include:  Having more frequent prenatal exams to check for signs of preeclampsia, if you have an increased risk for preeclampsia.  Medicine to lower your blood pressure.  Staying in the hospital, if your condition is severe. There, treatment will focus on controlling your blood pressure and the amount of fluids in your body (fluid retention).  Taking medicine (magnesium sulfate) to prevent seizures. This may be given as an injection or through an IV.  Taking a low-dose aspirin during your pregnancy.  Delivering your baby early. You may have your labor started with medicine (induced), or you may have a cesarean delivery. Follow these instructions at home: Eating and drinking   Drink enough fluid to keep your urine pale yellow.  Avoid caffeine. Lifestyle  Do not use any products that contain nicotine or tobacco, such as cigarettes and e-cigarettes. If you need help quitting, ask your health care provider.  Do not use alcohol or drugs.  Avoid stress as much as possible. Rest and get   plenty of sleep. General instructions  Take over-the-counter and prescription medicines only as told by your health care provider.  When lying down, lie on your left side. This keeps pressure off your  major blood vessels.  When sitting or lying down, raise (elevate) your feet. Try putting some pillows underneath your lower legs.  Exercise regularly. Ask your health care provider what kinds of exercise are best for you.  Keep all follow-up and prenatal visits as told by your health care provider. This is important. How is this prevented? There is no known way of preventing preeclampsia or eclampsia from developing. However, to lower your risk of complications and detect problems early:  Get regular prenatal care. Your health care provider may be able to diagnose and treat the condition early.  Maintain a healthy weight. Ask your health care provider for help managing weight gain during pregnancy.  Work with your health care provider to manage any long-term (chronic) health conditions you have, such as diabetes or kidney problems.  You may have tests of your blood pressure and kidney function after giving birth.  Your health care provider may have you take low-dose aspirin during your next pregnancy. Contact a health care provider if:  You have symptoms that your health care provider told you may require more treatment or monitoring, such as: ? Headaches. ? Nausea or vomiting. ? Abdominal pain. ? Dizziness. ? Light-headedness. Get help right away if:  You have severe: ? Abdominal pain. ? Headaches that do not get better. ? Dizziness. ? Vision problems. ? Confusion. ? Nausea or vomiting.  You have any of the following: ? A seizure. ? Sudden, rapid weight gain. ? Sudden swelling in your hands, ankles, or face. ? Trouble moving any part of your body. ? Numbness in any part of your body. ? Trouble speaking. ? Abnormal bleeding.  You faint. Summary  Preeclampsia is a serious condition that may develop during pregnancy.  This condition causes high blood pressure and increased protein in your urine along with other symptoms, such as headaches and vision  changes.  Diagnosing and treating preeclampsia early is very important. If not treated early, it can cause serious problems for you and your baby.  Get help right away if you have symptoms that your health care provider told you to watch for. This information is not intended to replace advice given to you by your health care provider. Make sure you discuss any questions you have with your health care provider. Document Revised: 06/24/2018 Document Reviewed: 05/28/2016 Elsevier Patient Education  [place].        Hypertension During Pregnancy High blood pressure (hypertension) is when the force of blood pumping through the arteries is too strong. Arteries are blood vessels that carry blood from the heart throughout the body. Hypertension during pregnancy can be mild or severe. Severe hypertension during pregnancy (preeclampsia) is a medical emergency that requires prompt evaluation and treatment. Different types of hypertension can happen during pregnancy. These include:  Chronic hypertension. This happens when you had high blood pressure before you became pregnant, and it continues during the pregnancy. Hypertension that develops before you are [redacted] weeks pregnant and continues during the pregnancy is also called chronic hypertension. If you have chronic hypertension, it will not go away after you have your baby. You will need follow-up visits with your health care provider after you have your baby. Your doctor may want you to keep taking medicine for your blood pressure.  Gestational hypertension. This  is hypertension that develops after the 20th week of pregnancy. Gestational hypertension usually goes away after you have your baby, but your health care provider will need to monitor your blood pressure to make sure that it is getting better.  Preeclampsia. This is severe hypertension during pregnancy. This can cause serious complications for you and your baby and can also cause  complications for you after the delivery of your baby.  Postpartum preeclampsia. You may develop severe hypertension after giving birth. This usually occurs within 48 hours after childbirth but may occur up to 6 weeks after giving birth. This is rare. How does this affect me? Women who have hypertension during pregnancy have a greater chance of developing hypertension later in life or during future pregnancies. In some cases, hypertension during pregnancy can cause serious complications, such as:  Stroke.  Heart attack.  Injury to other organs, such as kidneys, lungs, or liver.  Preeclampsia.  Convulsions or seizures.  Placental abruption. How does this affect my baby? Hypertension during pregnancy can affect your baby. Your baby may:  Be born early (prematurely).  Not weigh as much as he or she should at birth (low birth weight).  Not tolerate labor well, leading to an unplanned cesarean delivery. What are the risks? There are certain factors that make it more likely for you to develop hypertension during pregnancy. These include:  Having hypertension during a previous pregnancy.  Being overweight.  Being age 18 or older.  Being pregnant for the first time.  Being pregnant with more than one baby.  Becoming pregnant using fertilization methods, such as IVF (in vitro fertilization).  Having other medical problems, such as diabetes, kidney disease, or lupus.  Having a family history of hypertension. What can I do to lower my risk? The exact cause of hypertension during pregnancy is not known. You may be able to lower your risk by:  Maintaining a healthy weight.  Eating a healthy and balanced diet.  Following your health care provider's instructions about treating any long-term conditions that you had before becoming pregnant. It is very important to keep all of your prenatal care appointments. Your health care provider will check your blood pressure and make sure  that your pregnancy is progressing as expected. If a problem is found, early treatment can prevent complications. How is this treated? Treatment for hypertension during pregnancy varies depending on the type of hypertension you have and how serious it is.  If you were taking medicine for high blood pressure before you became pregnant, talk with your health care provider. You may need to change medicine during pregnancy because some medicines, like ACE inhibitors, may not be considered safe for your baby.  If you have gestational hypertension, your health care provider may order medicine to treat this during pregnancy.  If you are at risk for preeclampsia, your health care provider may recommend that you take a low-dose aspirin during your pregnancy.  If you have severe hypertension, you may need to be hospitalized so you and your baby can be monitored closely. You may also need to be given medicine to lower your blood pressure. This medicine may be given by mouth or through an IV.  In some cases, if your condition gets worse, you may need to deliver your baby early. Follow these instructions at home: Eating and drinking   Drink enough fluid to keep your urine pale yellow.  Avoid caffeine. Lifestyle  Do not use any products that contain nicotine or tobacco, such as  cigarettes, e-cigarettes, and chewing tobacco. If you need help quitting, ask your health care provider.  Do not use alcohol or drugs.  Avoid stress as much as possible.  Rest and get plenty of sleep.  Regular exercise can help to reduce your blood pressure. Ask your health care provider what kinds of exercise are best for you. General instructions  Take over-the-counter and prescription medicines only as told by your health care provider.  Keep all prenatal and follow-up visits as told by your health care provider. This is important. Contact a health care provider if:  You have symptoms that your health care provider  told you may require more treatment or monitoring, such as: ? Headaches. ? Nausea or vomiting. ? Abdominal pain. ? Dizziness. ? Light-headedness. Get help right away if:  You have: ? Severe abdominal pain that does not get better with treatment. ? A severe headache that does not get better. ? Vomiting that does not get better. ? Sudden, rapid weight gain. ? Sudden swelling in your hands, ankles, or face. ? Vaginal bleeding. ? Blood in your urine. ? Blurred or double vision. ? Shortness of breath or chest pain. ? Weakness on one side of your body. ? Difficulty speaking.  Your baby is not moving as much as usual. Summary  High blood pressure (hypertension) is when the force of blood pumping through the arteries is too strong.  Hypertension during pregnancy can cause problems for you and your baby.  Treatment for hypertension during pregnancy varies depending on the type of hypertension you have and how serious it is.  Keep all prenatal and follow-up visits as told by your health care provider. This is important. This information is not intended to replace advice given to you by your health care provider. Make sure you discuss any questions you have with your health care provider. Document Revised: 02/12/2019 Document Reviewed: 11/18/2018 Elsevier Patient Education  [place].

## 2020-10-15 NOTE — [facility] Provider Note (Signed)
History     CSN: [id number]  Arrival date and time: 10/15/20 0305   Event Date/Time   First Provider Initiated Contact with Patient 10/15/20 0344      Chief Complaint  Patient presents with  . Hypertension   Ms. Baileigh Modisette is a 34 y.o. G3P2003 at [redacted]w[redacted]d who presents to [facility]U for preeclampsia evaluation after she took her BP at home and it was found to be in the severe range. Patient reports her BP cuff has not yet been checked for fit by her office.  Pt denies HA, blurry vision/seeing spots, N/V, epigastric pain, swelling in face and hands, sudden weight gain. Pt denies chest pain and SOB.  Pt denies constipation, diarrhea, or urinary problems. Pt denies fever, chills, fatigue, sweating or changes in appetite. Pt denies dizziness, light-headedness, weakness.  Pt denies VB, ctx, LOF and reports good FM.  Current PNC & next appt? [facility], Monday 10/17/2020   OB History    Gravida  3   Para  2   Term  2   Preterm      AB      Living  3     SAB      IAB      Ectopic      Multiple  1   Live Births  3           Past Medical History:  Diagnosis Date  . Anemia   . History of anemia     Past Surgical History:  Procedure Laterality Date  . 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 Depression Mother   . Breast cancer Maternal Grandmother   . Hypertension Maternal Grandmother   . Heart disease Neg Hx     Social History   Tobacco Use  . Smoking status: Never Smoker  . Smokeless tobacco: Never Used  Vaping Use  . Vaping Use: Never used  Substance Use Topics  . Alcohol use: No  . Drug use: No    Allergies:  Allergies  Allergen Reactions  . Shellfish Allergy Hives and Itching    Medications Prior to Admission  Medication Sig Dispense Refill Last Dose  . aspirin 81 MG chewable tablet Chew by mouth daily.   10/14/2020 at Unknown time  . Prenatal Vit-Fe Fumarate-FA  (PRENATAL VITAMIN) 27-0.8 MG TABS Take 1 tablet by mouth daily. 30 tablet 2 10/14/2020 at Unknown time  . ferrous sulfate 325 (65 FE) MG tablet Take 325 mg by mouth daily with breakfast.       Review of Systems  Constitutional: Negative for chills, diaphoresis, fatigue and fever.  Eyes: Negative for visual disturbance.  Respiratory: Negative for shortness of breath.   Cardiovascular: Negative for chest pain.  Gastrointestinal: Negative for abdominal pain, constipation, diarrhea, nausea and vomiting.  Genitourinary: Negative for dysuria, flank pain, frequency, pelvic pain, urgency, vaginal bleeding and vaginal discharge.  Neurological: Negative for dizziness, weakness, light-headedness and headaches.   Physical Exam   Blood pressure (!) 144/88, pulse 74, temperature (!) 97.4 F (36.3 C), resp. rate 17, weight (!) 155.8 kg, last menstrual period 02/05/2020, SpO2 97 %.  Patient Vitals for the past 24 hrs:  BP Temp Pulse Resp SpO2 Weight  10/15/20 0501 (!) 144/88 -- 74 -- -- --  10/15/20 0447 (!) 145/92 -- 73 -- 97 % --  10/15/20 0431 (!) 146/93 -- 88 -- -- --  10/15/20 0416 Marland Kitchen)  142/81 -- 75 -- -- --  10/15/20 0401 (!) 141/81 -- 74 -- 98 % --  10/15/20 0339 (!) 146/82 -- 78 -- -- --  10/15/20 0324 (!) 142/95 (!) 97.4 F (36.3 C) 84 17 -- (!) 155.8 kg   Physical Exam Vitals and nursing note reviewed.  Constitutional:      General: She is not in acute distress.    Appearance: Normal appearance. She is not ill-appearing, toxic-appearing or diaphoretic.  HENT:     Head: Normocephalic and atraumatic.  Pulmonary:     Effort: Pulmonary effort is normal.  Neurological:     Mental Status: She is alert and oriented to person, place, and time.  Psychiatric:        Mood and Affect: Mood normal.        Behavior: Behavior normal.        Thought Content: Thought content normal.        Judgment: Judgment normal.    Results for orders placed or performed during the hospital encounter of  10/15/20 (from the past 24 hour(s))  Urinalysis, Routine w reflex microscopic     Status: Abnormal   Collection Time: 10/15/20  3:26 AM  Result Value Ref Range   Color, Urine YELLOW YELLOW   APPearance HAZY (A) CLEAR   Specific Gravity, Urine 1.013 1.005 - 1.030   pH 6.0 5.0 - 8.0   Glucose, UA NEGATIVE NEGATIVE mg/dL   Hgb urine dipstick NEGATIVE NEGATIVE   Bilirubin Urine NEGATIVE NEGATIVE   Ketones, ur NEGATIVE NEGATIVE mg/dL   Protein, ur NEGATIVE NEGATIVE mg/dL   Nitrite NEGATIVE NEGATIVE   Leukocytes,Ua NEGATIVE NEGATIVE  Protein / creatinine ratio, urine     Status: Abnormal   Collection Time: 10/15/20  3:40 AM  Result Value Ref Range   Creatinine, Urine 128.60 mg/dL   Total Protein, Urine 25 mg/dL   Protein Creatinine Ratio 0.19 (H) 0.00 - 0.15 mg/mg[Cre]  CBC     Status: Abnormal   Collection Time: 10/15/20  3:51 AM  Result Value Ref Range   WBC 9.1 4.0 - 10.5 K/uL   RBC 3.48 (L) 3.87 - 5.11 MIL/uL   Hemoglobin 9.8 (L) 12.0 - 15.0 g/dL   HCT 29.9 (L) 36.0 - 46.0 %   MCV 85.9 80.0 - 100.0 fL   MCH 28.2 26.0 - 34.0 pg   MCHC 32.8 30.0 - 36.0 g/dL   RDW 14.7 11.5 - 15.5 %   Platelets 334 150 - 400 K/uL   nRBC 0.0 0.0 - 0.2 %  Comprehensive metabolic panel     Status: Abnormal   Collection Time: 10/15/20  3:51 AM  Result Value Ref Range   Sodium 136 135 - 145 mmol/L   Potassium 3.9 3.5 - 5.1 mmol/L   Chloride 104 98 - 111 mmol/L   CO2 22 22 - 32 mmol/L   Glucose, Bld 111 (H) 70 - 99 mg/dL   BUN 6 6 - 20 mg/dL   Creatinine, Ser 0.65 0.44 - 1.00 mg/dL   Calcium 9.7 8.9 - 10.3 mg/dL   Total Protein 6.5 6.5 - 8.1 g/dL   Albumin 2.5 (L) 3.5 - 5.0 g/dL   AST 22 15 - 41 U/L   ALT 20 0 - 44 U/L   Alkaline Phosphatase 112 38 - 126 U/L   Total Bilirubin 0.3 0.3 - 1.2 mg/dL   GFR, Estimated >60 >60 mL/min   Anion gap 10 5 - 15    CT HEAD WO CONTRAST  Result Date: 10/12/2020 CLINICAL DATA:  Syncope, recurrent. EXAM: CT HEAD WITHOUT CONTRAST TECHNIQUE: Contiguous  axial images were obtained from the base of the skull through the vertex without intravenous contrast. COMPARISON:  Report from head CT 05/13/2004 (images unavailable). FINDINGS: Brain: Cerebral volume is normal. There is no acute intracranial hemorrhage. No demarcated cortical infarct. No extra-axial fluid collection. No evidence of intracranial mass. No midline shift. Vascular: No hyperdense vessel. Skull: Normal. Negative for fracture or focal lesion. Sinuses/Orbits: Visualized orbits show no acute finding. No significant paranasal sinus disease at the imaged levels. Other: Right periorbital soft tissue swelling/hematoma. IMPRESSION: Unremarkable non-contrast CT appearance of the brain. No evidence of acute intracranial abnormality. Right periorbital soft tissue swelling/hematoma. Electronically Signed   By: Kellie Simmering DO   On: 10/12/2020 11:16   CT Angio Chest PE W and/or Wo Contrast  Result Date: 10/12/2020 CLINICAL DATA:  Syncope.  Patient is [redacted] weeks pregnant. EXAM: CT ANGIOGRAPHY CHEST WITH CONTRAST TECHNIQUE: Multidetector CT imaging of the chest was performed using the standard protocol during bolus administration of intravenous contrast. Multiplanar CT image reconstructions and MIPs were obtained to evaluate the vascular anatomy. CONTRAST:  40mL OMNIPAQUE IOHEXOL 350 MG/ML SOLN COMPARISON:  None. FINDINGS: Cardiovascular: Satisfactory opacification of the pulmonary arteries to the segmental level. No evidence of pulmonary embolism. Thoracic aorta is normal in course and caliber. Normal heart size. Trace pericardial effusion. Mediastinum/Nodes: No enlarged mediastinal, hilar, or axillary lymph nodes. Thyroid gland, trachea, and esophagus demonstrate no significant findings. Lungs/Pleura: Mild dependent bibasilar subsegmental atelectasis. Lungs are otherwise clear. No pleural effusion or pneumothorax. Upper Abdomen: Small hiatal hernia. Musculoskeletal: No chest wall abnormality. No acute or  significant osseous findings. Review of the MIP images confirms the above findings. IMPRESSION: 1. No evidence of pulmonary embolism or other acute intrathoracic process. 2. Trace pericardial effusion. 3. Small hiatal hernia. Electronically Signed   By: Davina Poke D.O.   On: 10/12/2020 09:40    MAU Course  Procedures  MDM -preeclampsia evaluation without severe range BP in MAU on admission -symptoms include: none -UA: hazy -CBC: H/H 9.8/29.9, platelets 334 -CMP: serum creatinine 0.65, AST/ALT 22/20 -PCr: 0.19 -EFM: reactive       -baseline: 120       -variability: moderate       -accels: present, 15x15       -decels: absent       -TOCO: few, irregular ctx, pt not feeling -pt discharged to home in stable condition  Orders Placed This Encounter  Procedures  . Urinalysis, Routine w reflex microscopic    Standing Status:   Standing    Number of Occurrences:   1  . CBC    Standing Status:   Standing    Number of Occurrences:   1  . Comprehensive metabolic panel    Standing Status:   Standing    Number of Occurrences:   1  . Protein / creatinine ratio, urine    Standing Status:   Standing    Number of Occurrences:   1  . Discharge patient    Order Specific Question:   Discharge disposition    Answer:   01-Home or Self Care [1]    Order Specific Question:   Discharge patient date    Answer:   10/15/2020   No orders of the defined types were placed in this encounter.  Assessment and Plan   1. Gestational hypertension, third trimester   2. [redacted] weeks gestation of pregnancy   3.  NST (non-stress test) reactive     Allergies as of 10/15/2020      Reactions   Shellfish Allergy Hives, Itching      Medication List    TAKE these medications   aspirin 81 MG chewable tablet Chew by mouth daily.   ferrous sulfate 325 (65 FE) MG tablet Take 325 mg by mouth daily with breakfast.   Prenatal Vitamin 27-0.8 MG Tabs Take 1 tablet by mouth daily.       -Reviewed  warning blood pressure values (systolic = / > 322 and/or diastolic =/> 90). Explained that, if blood pressure is elevated, she should sit down, rest, and eat/drink something. If still elevated 15 minutes later, and she is greater than 20 weeks, she should call clinic or come to MAU. She should come to MAU if she has elevated pressures and any of the following:  - headache not relieved with tylenol, rest, hydration -blurry vision, floating spots in her vision - sudden full-body edema or facial edema -RUQ pain that is constant. These symptoms may indicate that her blood pressure is worsening and she may be developing gestational hypertension or pre-eclampsia, which is an emergency.  -pt to bring BP cuff to visit with [facility] on Monday -return MAU precautions given -pt discharged to home in stable condition  Elmyra Ricks E Jeremie Abdelaziz 10/15/2020, 5:16 AM

## 2020-10-15 NOTE — MAU Note (Signed)
BP at home 171/98 around 0000.  Denies HA/visual disturbances/epigastric pain.  Denies LOF/VB/CTX.  Endorses + FM.

## 2020-10-17 ENCOUNTER — Telehealth (HOSPITAL_COMMUNITY): Payer: Self-pay | Admitting: *Deleted

## 2020-10-17 ENCOUNTER — Encounter (HOSPITAL_COMMUNITY): Payer: Self-pay | Admitting: *Deleted

## 2020-10-17 NOTE — Telephone Encounter (Signed)
Preadmission screen  

## 2020-10-19 ENCOUNTER — Other Ambulatory Visit (HOSPITAL_COMMUNITY)
Admit: 2020-10-19 | Discharge: 2020-10-19 | Disposition: A | Discharge status: Home / Self Care | Payer: Medicaid Other | Attending: Obstetrics and Gynecology | Admitting: Obstetrics and Gynecology

## 2020-10-19 LAB — SARS CORONAVIRUS 2 (TAT 6-24 HRS): SARS Coronavirus 2: NEGATIVE

## 2020-10-21 ENCOUNTER — Inpatient Hospital Stay (HOSPITAL_COMMUNITY)
Admission: AD | Admit: 2020-10-21 | Discharge: 2020-10-24 | DRG: 806 | Disposition: A | Discharge status: Home / Self Care | Payer: Medicaid Other | Attending: Obstetrics and Gynecology | Admitting: Obstetrics and Gynecology

## 2020-10-21 ENCOUNTER — Encounter (HOSPITAL_COMMUNITY): Payer: Self-pay | Admitting: Obstetrics and Gynecology

## 2020-10-21 ENCOUNTER — Inpatient Hospital Stay (HOSPITAL_COMMUNITY): Payer: Medicaid Other

## 2020-10-21 ENCOUNTER — Other Ambulatory Visit: Payer: Self-pay

## 2020-10-21 DIAGNOSIS — D62 Acute posthemorrhagic anemia: Secondary | ICD-10-CM | POA: Diagnosis not present

## 2020-10-21 DIAGNOSIS — Z3A37 37 weeks gestation of pregnancy: Secondary | ICD-10-CM

## 2020-10-21 DIAGNOSIS — D252 Subserosal leiomyoma of uterus: Secondary | ICD-10-CM | POA: Diagnosis present

## 2020-10-21 DIAGNOSIS — O134 Gestational [pregnancy-induced] hypertension without significant proteinuria, complicating childbirth: Secondary | ICD-10-CM | POA: Diagnosis present

## 2020-10-21 DIAGNOSIS — O9081 Anemia of the puerperium: Secondary | ICD-10-CM | POA: Diagnosis not present

## 2020-10-21 DIAGNOSIS — O133 Gestational [pregnancy-induced] hypertension without significant proteinuria, third trimester: Secondary | ICD-10-CM

## 2020-10-21 DIAGNOSIS — O3413 Maternal care for benign tumor of corpus uteri, third trimester: Secondary | ICD-10-CM | POA: Diagnosis present

## 2020-10-21 DIAGNOSIS — E559 Vitamin D deficiency, unspecified: Secondary | ICD-10-CM

## 2020-10-21 DIAGNOSIS — O1494 Unspecified pre-eclampsia, complicating childbirth: Secondary | ICD-10-CM | POA: Diagnosis present

## 2020-10-21 DIAGNOSIS — R55 Syncope and collapse: Secondary | ICD-10-CM

## 2020-10-21 DIAGNOSIS — Z3490 Encounter for supervision of normal pregnancy, unspecified, unspecified trimester: Secondary | ICD-10-CM

## 2020-10-21 DIAGNOSIS — O1493 Unspecified pre-eclampsia, third trimester: Secondary | ICD-10-CM

## 2020-10-21 LAB — COMPREHENSIVE METABOLIC PANEL
ALT: 14 U/L (ref 0–44)
AST: 19 U/L (ref 15–41)
Albumin: 2.6 g/dL — ABNORMAL LOW (ref 3.5–5.0)
Alkaline Phosphatase: 125 U/L (ref 38–126)
Anion gap: 10 (ref 5–15)
BUN: 6 mg/dL (ref 6–20)
CO2: 23 mmol/L (ref 22–32)
Calcium: 9.1 mg/dL (ref 8.9–10.3)
Chloride: 103 mmol/L (ref 98–111)
Creatinine, Ser: 0.69 mg/dL (ref 0.44–1.00)
GFR, Estimated: >60 mL/min (ref >60)
Glucose, Bld: 93 mg/dL (ref 70–99)
Potassium: 3.9 mmol/L (ref 3.5–5.1)
Sodium: 136 mmol/L (ref 135–145)
Total Bilirubin: 0.5 mg/dL (ref 0.3–1.2)
Total Protein: 6.7 g/dL (ref 6.5–8.1)

## 2020-10-21 LAB — PROTEIN / CREATININE RATIO, URINE
Creatinine, Urine: 98.8 mg/dL
Protein Creatinine Ratio: 0.14 mg/mg{Cre} (ref 0.00–0.15)
Total Protein, Urine: 14 mg/dL

## 2020-10-21 LAB — CBC
HCT: 31.3 % — ABNORMAL LOW (ref 36.0–46.0)
Hemoglobin: 10 g/dL — ABNORMAL LOW (ref 12.0–15.0)
MCH: 27.8 pg (ref 26.0–34.0)
MCHC: 31.9 g/dL (ref 30.0–36.0)
MCV: 86.9 fL (ref 80.0–100.0)
Platelets: 353 10*3/uL (ref 150–400)
RBC: 3.6 MIL/uL — ABNORMAL LOW (ref 3.87–5.11)
RDW: 14.6 % (ref 11.5–15.5)
WBC: 7.3 10*3/uL (ref 4.0–10.5)
nRBC: 0 % (ref 0.0–0.2)

## 2020-10-21 LAB — TYPE AND SCREEN
ABO/RH(D): A POS
Antibody Screen: NEGATIVE

## 2020-10-21 LAB — RPR: RPR Ser Ql: NONREACTIVE

## 2020-10-21 MED ORDER — EPHEDRINE 5 MG/ML INJ: 10.0000 mg | INTRAVENOUS | Status: DC | PRN

## 2020-10-21 MED ORDER — OXYCODONE-ACETAMINOPHEN 5-325 MG PO TABS
2.0000 | ORAL_TABLET | ORAL | Status: DC | PRN
Start: 2020-10-21 — End: 2020-10-22

## 2020-10-21 MED ORDER — LACTATED RINGERS IV SOLN
500.0000 mL | INTRAVENOUS | Status: DC | PRN
  Administered 2020-10-22 (×2): 500 mL via INTRAVENOUS

## 2020-10-21 MED ORDER — FENTANYL-BUPIVACAINE-NACL 0.5-0.125-0.9 MG/250ML-% EP SOLN
12.0000 mL/h | EPIDURAL | Status: DC | PRN
  Filled 2020-10-21: qty 250

## 2020-10-21 MED ORDER — ONDANSETRON HCL 4 MG/2ML IJ SOLN: 4.0000 mg | Freq: Four times a day (QID) | INTRAMUSCULAR | Status: DC | PRN

## 2020-10-21 MED ORDER — FENTANYL CITRATE (PF) 100 MCG/2ML IJ SOLN
100.0000 ug | INTRAMUSCULAR | Status: DC | PRN
  Administered 2020-10-22 (×7): 100 ug via INTRAVENOUS
  Filled 2020-10-21 (×7): qty 2

## 2020-10-21 MED ORDER — PHENYLEPHRINE 40 MCG/ML (10ML) SYRINGE FOR IV PUSH (FOR BLOOD PRESSURE SUPPORT): 80.0000 ug | PREFILLED_SYRINGE | INTRAVENOUS | Status: DC | PRN

## 2020-10-21 MED ORDER — OXYTOCIN BOLUS FROM INFUSION
333.0000 mL | Freq: Once | INTRAVENOUS | Status: AC
  Administered 2020-10-22: 21:00:00 333 mL via INTRAVENOUS

## 2020-10-21 MED ORDER — DIPHENHYDRAMINE HCL 50 MG/ML IJ SOLN
12.5000 mg | INTRAMUSCULAR | Status: DC | PRN
Start: 2020-10-21 — End: 2020-10-22

## 2020-10-21 MED ORDER — MISOPROSTOL 25 MCG QUARTER TABLET
ORAL_TABLET | ORAL | Status: AC
  Filled 2020-10-21: qty 1

## 2020-10-21 MED ORDER — LIDOCAINE HCL (PF) 1 % IJ SOLN: 30.0000 mL | INTRAMUSCULAR | Status: DC | PRN

## 2020-10-21 MED ORDER — ACETAMINOPHEN 325 MG PO TABS: 650.0000 mg | ORAL_TABLET | ORAL | Status: DC | PRN

## 2020-10-21 MED ORDER — SOD CITRATE-CITRIC ACID 500-334 MG/5ML PO SOLN
30.0000 mL | ORAL | Status: DC | PRN
  Filled 2020-10-21: qty 15

## 2020-10-21 MED ORDER — OXYTOCIN-SODIUM CHLORIDE 30-0.9 UT/500ML-% IV SOLN
1.0000 m[IU]/min | INTRAVENOUS | Status: DC
  Administered 2020-10-21 – 2020-10-22 (×2): 2 m[IU]/min via INTRAVENOUS
  Filled 2020-10-21: qty 500

## 2020-10-21 MED ORDER — OXYTOCIN-SODIUM CHLORIDE 30-0.9 UT/500ML-% IV SOLN
2.5000 [IU]/h | INTRAVENOUS | Status: DC
  Filled 2020-10-21: qty 500

## 2020-10-21 MED ORDER — LACTATED RINGERS IV SOLN: 500.0000 mL | Freq: Once | INTRAVENOUS | Status: DC

## 2020-10-21 MED ORDER — OXYCODONE-ACETAMINOPHEN 5-325 MG PO TABS: 1.0000 | ORAL_TABLET | ORAL | Status: DC | PRN

## 2020-10-21 MED ORDER — OXYTOCIN-SODIUM CHLORIDE 30-0.9 UT/500ML-% IV SOLN: 1.0000 m[IU]/min | INTRAVENOUS | Status: DC

## 2020-10-21 MED ORDER — LACTATED RINGERS IV SOLN: INTRAVENOUS | Status: DC

## 2020-10-21 MED ORDER — MISOPROSTOL 25 MCG QUARTER TABLET: 25.0000 ug | ORAL_TABLET | Freq: Once | ORAL | Status: DC

## 2020-10-21 MED ORDER — MISOPROSTOL 50MCG HALF TABLET
50.0000 ug | ORAL_TABLET | ORAL | Status: DC | PRN
  Administered 2020-10-21 (×2): 50 ug via ORAL
  Administered 2020-10-21: 18:00:00 25 ug via ORAL
  Filled 2020-10-21 (×2): qty 1

## 2020-10-21 NOTE — H&P (Signed)
OB ADMISSION/ HISTORY & PHYSICAL:  Admission Date: 10/21/2020  7:35 AM  Admit Diagnosis: IOL for preeclampsia  Lydia Mendoza is a 34 y.o. female G2P2003 [redacted]w[redacted]d presenting for IOL for preeclampsia w/o SF. Denies HA, Visual disturbances or epigastric pain. Endorses active FM, denies LOF and vaginal bleeding. SVE closed/50/high.   History of current pregnancy: G3P2003   Primary OB Provider: [facility] Patient entered care with [facility] at 11.3 wks.   EDC 11/11/20 by sure LMP and congruent w/ 11.2 wk U/S.   Anatomy scan:  21.4 wks, complete w/ posterior placenta.   Antenatal testing: for BMI 52 started at 32 weeks Last evaluation: 36.4  wks  Significant prenatal events: 3 Fibroids - Right posterior IM 1.2x2.07x1.01, right posterior anterior 1.9x1.2x1.5, Lt subserosal anterior 5.18 x 3.89 x 6.89 Patient Active Problem List   Diagnosis Date Noted  . Pregnant 10/21/2020  . Pre-eclampsia 10/13/2020  . Fall 10/12/2020  . Preeclampsia 10/12/2020  . Facial laceration 10/12/2020  . Near syncope 10/12/2020  . Vitamin D deficiency 10/12/2020  . PIH (pregnancy induced hypertension), third trimester 10/07/2020  . Seasonal allergic rhinitis due to pollen 03/11/2019    Prenatal Labs: ABO, Rh: --/--/A POS (12/17 3295) Antibody: NEG (12/17 0819) Rubella: Immune (06/21 0000)  RPR:   NR HBsAg: Negative (06/21 0000)  HIV: Non-reactive (06/21 0000) GTT: 125 GBS: Negative/-- (12/07 0000)  GC/CHL: Negative/Negative Genetics: Panorama low risk, horizon negative Tdap/influenza vaccines: declines/declines   OB History  Gravida Para Term Preterm AB Living  3 2 2     3   SAB IAB Ectopic Multiple Live Births        1 3    # Outcome Date GA Lbr Len/2nd Weight Sex Delivery Anes PTL Lv  3 Current           2A Term 05/01/11     Vag-Spont   LIV  2B Term 05/01/11     Vag-Spont   LIV  1 Term 11/23/05    F Vag-Spont   LIV    Medical / Surgical History: Past medical history:  Past Medical History:  Diagnosis  Date  . Anemia   . History of anemia   . Pregnancy induced hypertension     Past surgical history:  Past Surgical History:  Procedure Laterality Date  . 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 Depression Mother   . Breast cancer Maternal Grandmother   . Hypertension Maternal Grandmother   . Heart disease Neg Hx     Social History:  reports that she has never smoked. She has never used smokeless tobacco. She reports that she does not drink alcohol and does not use drugs.  Allergies: Shellfish allergy   Current Medications at time of admission:  Prior to Admission medications   Medication Sig Start Date End Date Taking? Authorizing Provider  aspirin 81 MG chewable tablet Chew by mouth daily.   Yes [provider]  ferrous sulfate 325 (65 FE) MG tablet Take 325 mg by mouth daily with breakfast.   Yes [provider]  Prenatal Vit-Fe Fumarate-FA (PRENATAL VITAMIN) 27-0.8 MG TABS Take 1 tablet by mouth daily. 03/21/20  Yes Nicolette Bang, DO    Review of Systems: Constitutional: Negative   HENT: Negative   Eyes: Negative   Respiratory: Negative   Cardiovascular: Negative   Gastrointestinal: Negative  Genitourinary: negative for bloody show, negative for  LOF   Musculoskeletal: Negative   Skin: Negative   Neurological: Negative   Endo/Heme/Allergies: Negative   Psychiatric/Behavioral: Negative    Physical Exam: VS: Blood pressure 121/72, pulse (!) 135, temperature 97.9 F (36.6 C), temperature source Oral, resp. rate 18, height 5\' 8"  (1.727 m), weight (!) 153.3 kg, last menstrual period 02/05/2020, SpO2 97 %. AAO x3, no signs of distress Cardiovascular: RRR Respiratory: Lung fields clear to ausculation GU/GI: Abdomen gravid, non-tender, non-distended, active FM, vertex,  Extremities: negative edema, negative for pain, tenderness, and cords  Cervical  exam:Dilation: Closed Effacement (%): Thick Exam by:: K. Trana Ressler, CNM FHR: baseline rate 145 / variability moderate / accelerations present / absent decelerations TOCO: none at present   Prenatal Transfer Tool  Maternal Diabetes: No Genetic Screening: Normal Maternal Ultrasounds/Referrals: Normal Fetal Ultrasounds or other Referrals:  None Maternal Substance Abuse:  No Significant Maternal Medications:  None Significant Maternal Lab Results: Group B Strep negative    Assessment: 34 y.o. G3P2003 [redacted]w[redacted]d IOL for preeclampsia.   IOL of labor - cytotec now FHR category 1 GBS negative Pain management plan: epidural prn   Plan:  Admit to L&D Routine admission orders Epidural PRN Preeclampsia - CBC, CMP, PCR ordered  Dr Mancel Bale notified of admission and plan of care reviewed.  Tift MSN, CNM 10/21/2020 9:37 AM

## 2020-10-21 NOTE — Progress Notes (Signed)
Subjective:    Ardyth is doing well. Resting quietly. C/O increase pain with ctx. SVE closed/ 70/high. Pitocin discussed with pt including R/B/A. Pt verbalized ok for the use of pitocin d/t ctx too close for another cytotec at present. CAT 1.  Objective:    VS: BP (!) 152/86   Pulse 77   Temp 98.3 F (36.8 C) (Oral)   Resp 17   Ht 5\' 8"  (1.727 m)   Wt (!) 153.3 kg   LMP 02/05/2020 (Exact Date)   SpO2 97%   BMI 51.39 kg/m  FHR : baseline 125 / variability moderate / accelerations present / absent decelerations Toco: contractions every 1-2 minutes  Membranes: Intact Dilation: Closed Effacement (%): 70 Cervical Position: Posterior Station: Ballotable Presentation: Vertex Exam by:: K. Tanja Gift, CNM   Assessment/Plan:   34 y.o. G3P2003 [redacted]w[redacted]d IOL for hx of preeclampsia during pregnancy. Labs now show GHTN.  Labor: Cytotec x 3, Now starting pitocin  Preeclampsia:  labs stable Fetal Wellbeing:  Category I Pain Control:  Labor support without medications I/D:  GBS negative Anticipated MOD:  NSVD  Domingo Pulse MSN, CNM 10/21/2020 9:54 PM

## 2020-10-21 NOTE — Progress Notes (Signed)
   Subjective:  Lydia Mendoza is a 34 y.o. G3P2003 at [redacted]w[redacted]d by LMP admitted for IOL for preeclampsia w/o SF. Doing well. Starting to feel some ctxs. SVE closed/70/high. R/B/A reviewed again with Lydia Mendoza. Verbalized continuation of IOL with another round of cytotec. Denies HA, Visual changes or epigastric pain. Does not desire epidural or IV pain meds in labor.  Objective: Vitals:   10/21/20 1131 10/21/20 1201 10/21/20 1235 10/21/20 1302  BP: (!) 145/79 138/82 (!) 115/97 131/86  Pulse: 89 80 98 95  Resp:      Temp:      TempSrc:      SpO2:      Weight:      Height:        No intake/output data recorded.    FHT:  FHR: 140 bpm, variability: moderate,  accelerations:  Present,  decelerations:  Absent UC:   regular, every 3.5-5 minutes SVE:   Dilation: Closed Effacement (%): Thick Exam by:: Lydia Mendoza, CNM  Labs:   Recent Labs    10/21/20 0846  WBC 7.3  HGB 10.0*  HCT 31.3*  PLT 353    Assessment / Plan: 35 y.o. AGP@ [redacted]w[redacted]d IOL for preeclampsia w/o SF  Labor: IOL cyotec x 1, will repeat dose Preeclampsia:  no signs or symptoms of toxicity and labs stable Fetal Wellbeing:  Category I Pain Control:  Labor support without medications I/D:  GBS negative Anticipated MOD:  NSVD   Holli Humbles MSN, CNM 10/21/2020, 1:35 PM

## 2020-10-21 NOTE — Progress Notes (Signed)
Subjective:    Pt resting quietly. Feeling ctx but denies need for pain medication. SVE closed/50/high. CAT 1 tracing. Cytotec 29mcg placed vaginally.   Objective:    VS: BP 134/68   Pulse 88   Temp 97.9 F (36.6 C) (Oral)   Resp 18   Ht 5\' 8"  (1.727 m)   Wt (!) 153.3 kg   LMP 02/05/2020 (Exact Date)   SpO2 97%   BMI 51.39 kg/m  FHR : baseline 140 / variability moderate / accelerations present / absent decelerations Toco: contractions every 2-3 minutes  Membranes: Intact Dilation: Closed Effacement (%): 50 Station: Ballotable Presentation: Vertex Exam by:: Trine Fread   Assessment/Plan:   34 y.o. G3P2003 [redacted]w[redacted]d IOL for preeclampsia w/o SF  Labor: IOL - cytotec x 3 doses Preeclampsia:  labs stable , no meds Fetal Wellbeing:  Category I Pain Control:  Labor support without medications I/D:  GBS negative Anticipated MOD:  NSVD    Domingo Pulse MSN, CNM 10/21/2020 6:15 PM

## 2020-10-22 MED ORDER — PHENYLEPHRINE 40 MCG/ML (10ML) SYRINGE FOR IV PUSH (FOR BLOOD PRESSURE SUPPORT): 80.0000 ug | PREFILLED_SYRINGE | INTRAVENOUS | Status: DC | PRN

## 2020-10-22 MED ORDER — ACETAMINOPHEN 325 MG PO TABS: 650.0000 mg | ORAL_TABLET | ORAL | Status: DC | PRN

## 2020-10-22 MED ORDER — DIPHENHYDRAMINE HCL 50 MG/ML IJ SOLN: 12.5000 mg | INTRAMUSCULAR | Status: DC | PRN

## 2020-10-22 MED ORDER — BENZOCAINE-MENTHOL 20-0.5 % EX AERO: 1.0000 "application " | INHALATION_SPRAY | CUTANEOUS | Status: DC | PRN

## 2020-10-22 MED ORDER — ONDANSETRON HCL 4 MG/2ML IJ SOLN: 4.0000 mg | INTRAMUSCULAR | Status: DC | PRN

## 2020-10-22 MED ORDER — SIMETHICONE 80 MG PO CHEW: 80.0000 mg | CHEWABLE_TABLET | ORAL | Status: DC | PRN

## 2020-10-22 MED ORDER — EPHEDRINE 5 MG/ML INJ: 10.0000 mg | INTRAVENOUS | Status: DC | PRN

## 2020-10-22 MED ORDER — ZOLPIDEM TARTRATE 5 MG PO TABS: 5.0000 mg | ORAL_TABLET | Freq: Every evening | ORAL | Status: DC | PRN

## 2020-10-22 MED ORDER — OXYTOCIN-SODIUM CHLORIDE 30-0.9 UT/500ML-% IV SOLN: 1.0000 m[IU]/min | INTRAVENOUS | Status: DC

## 2020-10-22 MED ORDER — ONDANSETRON HCL 4 MG PO TABS: 4.0000 mg | ORAL_TABLET | ORAL | Status: DC | PRN

## 2020-10-22 MED ORDER — SENNOSIDES-DOCUSATE SODIUM 8.6-50 MG PO TABS
2.0000 | ORAL_TABLET | Freq: Every day | ORAL | Status: DC
  Administered 2020-10-23 – 2020-10-24 (×2): 2 via ORAL
  Filled 2020-10-22 (×2): qty 2

## 2020-10-22 MED ORDER — OXYTOCIN-SODIUM CHLORIDE 30-0.9 UT/500ML-% IV SOLN: 2.5000 [IU]/h | INTRAVENOUS | Status: DC

## 2020-10-22 MED ORDER — TETANUS-DIPHTH-ACELL PERTUSSIS 5-2.5-18.5 LF-MCG/0.5 IM SUSY: 0.5000 mL | PREFILLED_SYRINGE | Freq: Once | INTRAMUSCULAR | Status: DC

## 2020-10-22 MED ORDER — DIPHENHYDRAMINE HCL 25 MG PO CAPS: 25.0000 mg | ORAL_CAPSULE | Freq: Four times a day (QID) | ORAL | Status: DC | PRN

## 2020-10-22 MED ORDER — TERBUTALINE SULFATE 1 MG/ML IJ SOLN
INTRAMUSCULAR | Status: AC
  Administered 2020-10-22: 21:00:00 1 mg
  Filled 2020-10-22: qty 1

## 2020-10-22 MED ORDER — PRENATAL MULTIVITAMIN CH
1.0000 | ORAL_TABLET | Freq: Every day | ORAL | Status: DC
  Administered 2020-10-23 – 2020-10-24 (×2): 1 via ORAL
  Filled 2020-10-22 (×2): qty 1

## 2020-10-22 MED ORDER — LACTATED RINGERS IV SOLN: 500.0000 mL | Freq: Once | INTRAVENOUS | Status: DC

## 2020-10-22 MED ORDER — DIBUCAINE (PERIANAL) 1 % EX OINT: 1.0000 "application " | TOPICAL_OINTMENT | CUTANEOUS | Status: DC | PRN

## 2020-10-22 MED ORDER — COCONUT OIL OIL: 1.0000 "application " | TOPICAL_OIL | Status: DC | PRN

## 2020-10-22 MED ORDER — WITCH HAZEL-GLYCERIN EX PADS: 1.0000 "application " | MEDICATED_PAD | CUTANEOUS | Status: DC | PRN

## 2020-10-22 MED ORDER — FENTANYL-BUPIVACAINE-NACL 0.5-0.125-0.9 MG/250ML-% EP SOLN: 12.0000 mL/h | EPIDURAL | Status: DC | PRN

## 2020-10-22 MED ORDER — IBUPROFEN 600 MG PO TABS
600.0000 mg | ORAL_TABLET | Freq: Four times a day (QID) | ORAL | Status: DC
  Administered 2020-10-23 – 2020-10-24 (×7): 600 mg via ORAL
  Filled 2020-10-22 (×6): qty 1

## 2020-10-22 NOTE — Progress Notes (Signed)
Subjective:    SVE 0/50/Ballotable per RN. Continue to titrate pitocin as needed.   Objective:    VS: BP (!) 123/59   Pulse 73   Temp 97.9 F (36.6 C) (Oral)   Resp 16   Ht 5\' 8"  (1.727 m)   Wt (!) 153.3 kg   LMP 02/05/2020 (Exact Date)   SpO2 97%   BMI 51.39 kg/m  FHR : baseline 135 / variability moderate / accelerations present / absent decelerations Toco: contractions every 3-5 minutes  Membranes:Intact Dilation: Closed Effacement (%): 50 Cervical Position: Middle Station: Ballotable Presentation: Vertex Exam by:: Eual Fines, RN Pitocin 14 mU/min  Assessment/Plan:   34 y.o. G3P2003 [redacted]w[redacted]d  Labor: Cytotec x 3, Currently on pitocin. Titrate 2x2. Foley bulb or AROM when possible. Preeclampsia:  labs stable Fetal Wellbeing:  Category I Pain Control:  IV pain meds I/D:  GBS negative Anticipated MOD:  NSVD  Domingo Pulse MSN, CNM 10/22/2020 6:30 AM

## 2020-10-22 NOTE — Progress Notes (Signed)
**Note Lydia-Identified via Obfuscation** Labor Progress Note  Lydia Mendoza is a 34 y.o. female [id number] [redacted]w[redacted]d presenting on 12/17 @ 0000 for IOL for preeclampsia w/o SF. Denies HA, Visual disturbances or epigastric pain.   Subjective: Lydia Mendoza, 34 y.o., [phone], with an IUP @ [redacted]w[redacted]d, is resting in bed in NAD, feeling cxt but tolerates well. Reviewed POC, and R/B/A of IUPC placement, pt verbilized consent. Denies HA, RUQ pain or vision changes.  Patient Active Problem List   Diagnosis Date Noted  . Pregnant 10/21/2020  . Pre-eclampsia 10/13/2020  . Fall 10/12/2020  . Preeclampsia 10/12/2020  . Facial laceration 10/12/2020  . Near syncope 10/12/2020  . Vitamin D deficiency 10/12/2020  . PIH (pregnancy induced hypertension), third trimester 10/07/2020  . Seasonal allergic rhinitis due to pollen 03/11/2019   Objective: BP (!) 148/71   Pulse 79   Temp 97.9 F (36.6 C) (Oral)   Resp 18   Ht 5\' 8"  (1.727 m)   Wt (!) 153.3 kg   LMP 02/05/2020 (Exact Date)   SpO2 97%   BMI 51.39 kg/m  No intake/output data recorded. No intake/output data recorded. NST: FHR baseline 130 bpm, Variability: moderate, Accelerations:present, Decelerations:  Absent= Cat 1/Reactive CTX:  regular, every 3 minutes, lasting 50-120 seconds Uterus gravid, soft non tender, moderate to palpate with contractions.  SVE:  Dilation: 5 Effacement (%): 60 Station: -2 Exam by:: [employer], CNM Pitocin at 22 mUn/min IUPC placed with ease  MVU 210s  Assessment:  Lydia Mendoza is a 34 y.o. female G34P2003 [redacted]w[redacted]d presenting for IOL for preeclampsia w/o SF. Denies HA, Visual disturbances or epigastric pain. Pt progressing in early labor with x3 Cytotec on pitocin with foley bulb I/O, and AROM.  Patient Active Problem List   Diagnosis Date Noted  . Pregnant 10/21/2020  . Pre-eclampsia 10/13/2020  . Fall 10/12/2020  . Preeclampsia 10/12/2020  . Facial laceration 10/12/2020  . Near syncope 10/12/2020  . Vitamin D deficiency 10/12/2020  . PIH  (pregnancy induced hypertension), third trimester 10/07/2020  . Seasonal allergic rhinitis due to pollen 03/11/2019   NICHD: Category 1  Membranes:  AROM, clear, @ 12/18 @ 1239, no s/s of infection  Induction:    Cytotec x12/18 @ 9030, 1342, 1753  Foley Bulb: inserted  @ 0800 on 12/8  Pitocin - 22  IUPC Placed:  MVU 210s  Pain management:               IV pain management: x 12/18 @ 0018, 0923, 1030, 1249, 1545, 1724             Epidural placement:  PRN  GBS Negative  PreE w/o SF: PCR was 0.62 on 12/8, admitting labs WNL, asymptomatic, no meds, BP now 148/71, stable.    Plan: Continue labor plan Continuous monitoring Rest Ambulate Frequent position changes to facilitate fetal rotation and descent. Will reassess with cervical exam if/when necessary PreE: Monitor BP labetalol protocol as indicated.  Continue to increase pitocin 2x2, titrate to fetal tolerance or cxt pattern.  Anticipate labor progression and vaginal delivery.    Noralyn Pick, NP-C, CNM, MSN 10/22/2020. 5:32 PM

## 2020-10-22 NOTE — Progress Notes (Signed)
**Note Lydia-Identified via Obfuscation** Labor Progress Note  Lydia Mendoza is a 34 y.o. female [id number] [redacted]w[redacted]d presenting on 12/17 @ 0000 for IOL for preeclampsia w/o SF. Denies HA, Visual disturbances or epigastric pain.   Subjective: Lydia Mendoza, 34 y.o., [phone], with an IUP @ [redacted]w[redacted]d, is resting in bed in NAD, feeling cxt but tolerates well. Reviewed POC, and R/B/A of AROM, pt verbilized consent.  Patient Active Problem List   Diagnosis Date Noted  . Pregnant 10/21/2020  . Pre-eclampsia 10/13/2020  . Fall 10/12/2020  . Preeclampsia 10/12/2020  . Facial laceration 10/12/2020  . Near syncope 10/12/2020  . Vitamin D deficiency 10/12/2020  . PIH (pregnancy induced hypertension), third trimester 10/07/2020  . Seasonal allergic rhinitis due to pollen 03/11/2019   Objective: BP 126/62   Pulse 91   Temp 97.9 F (36.6 C) (Oral)   Resp 18   Ht 5\' 8"  (1.727 m)   Wt (!) 153.3 kg   LMP 02/05/2020 (Exact Date)   SpO2 97%   BMI 51.39 kg/m  No intake/output data recorded. No intake/output data recorded. NST: FHR baseline 135 bpm, Variability: moderate, Accelerations:present, Decelerations:  Absent= Cat 1/Reactive CTX:  irregular, every 2-5 minutes, lasting 50-120 seconds Uterus gravid, soft non tender, moderate to palpate with contractions.  SVE:  Dilation: 4.5 Effacement (%): 30 Station: -2 Exam by:: [facility], CNM Pitocin at 10 mUn/min Foley bulb out with bloody show.  AROM, clear tolerated well, clear fluids noted.   Assessment:  Lydia Mendoza is a 34 y.o. female G66P2003 [redacted]w[redacted]d presenting for IOL for preeclampsia w/o SF. Denies HA, Visual disturbances or epigastric pain. Pt progressing in early labor with x3 Cytotec on pitocin with foley bulb I/O, and AROM.  Patient Active Problem List   Diagnosis Date Noted  . Pregnant 10/21/2020  . Pre-eclampsia 10/13/2020  . Fall 10/12/2020  . Preeclampsia 10/12/2020  . Facial laceration 10/12/2020  . Near syncope 10/12/2020  . Vitamin D deficiency 10/12/2020  . PIH  (pregnancy induced hypertension), third trimester 10/07/2020  . Seasonal allergic rhinitis due to pollen 03/11/2019   NICHD: Category 1  Membranes:  AROM, clear, @ 12/18 @ 1239, no s/s of infection  Induction:    Cytotec x12/18 @ 1505, 1342, 1753  Foley Bulb: inserted  @ 0800 on 12/8  Pitocin - 10  Pain management:               IV pain management: x 12/18 @ 0018, 6979, 1030, 1249             Epidural placement:  PRN  GBS Negative  PreE w/o SF: PCR was 0.62 on 12/8, admitting labs WNL, asymptomatic, no meds, BP now 119/63, stable.    Plan: Continue labor plan Continuous monitoring Rest Ambulate Frequent position changes to facilitate fetal rotation and descent. Will reassess with cervical exam at 1700 or earlier if necessary PreE: Monitor BP labetalol protocol as indicated.  Continue to increase pitocin 2x2, titrate to fetal tolerance or cxt pattern.  Anticipate labor progression and vaginal delivery.    Noralyn Pick, NP-C, CNM, MSN 10/22/2020. 1:21 PM

## 2020-10-22 NOTE — Progress Notes (Signed)
Labor Progress Note  Lydia Mendoza is a 34 y.o. female [id number] [redacted]w[redacted]d presenting on 12/17 @ 0000 for IOL for preeclampsia w/o SF. Denies HA, Visual disturbances or epigastric pain.   Subjective: De Nurse, 35 y.o., [phone], with an IUP @ [redacted]w[redacted]d, is resting in bed in NAD, feeling cxt but tolerates well. Reviewed POC, Pitocin, and foley bulb R/B/A, pt verbalized ok to placed foley bulb. Also discussed pain management, pt plan natural delivery with IV pain meds.  Patient Active Problem List   Diagnosis Date Noted   Pregnant 10/21/2020   Pre-eclampsia 10/13/2020   Fall 10/12/2020   Preeclampsia 10/12/2020   Facial laceration 10/12/2020   Near syncope 10/12/2020   Vitamin D deficiency 10/12/2020   PIH (pregnancy induced hypertension), third trimester 10/07/2020   Seasonal allergic rhinitis due to pollen 03/11/2019   Objective: BP 126/62    Pulse 91    Temp 97.9 F (36.6 C) (Oral)    Resp 18    Ht 5\' 8"  (1.727 m)    Wt (!) 153.3 kg    LMP 02/05/2020 (Exact Date)    SpO2 97%    BMI 51.39 kg/m  No intake/output data recorded. No intake/output data recorded. NST: FHR baseline 135 bpm, Variability: moderate, Accelerations:present, Decelerations:  Absent= Cat 1/Reactive CTX:  irregular, every 2-5 minutes, lasting 50-120 seconds Uterus gravid, soft non tender, moderate to palpate with contractions.  SVE:  Dilation: 2 Effacement (%): Thick Station: [employer] Exam by:: [facility], CNM Pitocin at 47, but RN noted pitocin was not infusing since 7am mUn/min Foley bulb placed with ease, cook cath with 23mls NS instilled in uterine balloon, and 64mls NS in vaginal balloon.   Assessment:  Lydia Mendoza is a 34 y.o. female G85P2003 [redacted]w[redacted]d presenting for IOL for preeclampsia w/o SF. Denies HA, Visual disturbances or epigastric pain. Pt progressing in early labor with x3 Cytotec on pitocin with foley bulb placed.  Patient Active Problem List   Diagnosis Date Noted   Pregnant 10/21/2020    Pre-eclampsia 10/13/2020   Fall 10/12/2020   Preeclampsia 10/12/2020   Facial laceration 10/12/2020   Near syncope 10/12/2020   Vitamin D deficiency 10/12/2020   PIH (pregnancy induced hypertension), third trimester 10/07/2020   Seasonal allergic rhinitis due to pollen 03/11/2019   NICHD: Category 1  Membranes:  Intact, no s/s of infection  Induction:    Cytotec x12/18 @ 0942, 1342, 1753  Foley Bulb: inserted  @ 0800 on 12/8  Pitocin - 16 and turned off.   Pain management:               IV pain management: x 12/18 @ 0018, 7371, 1030, 1249             Epidural placement:  PRN  GBS Negative  PreE w/o SF: PCR was 0.62 on 12/8, admitting labs WNL, asymptomatic, no meds, BP now 126/62, stable.    Plan: Continue labor plan Continuous monitoring Rest Ambulate Frequent position changes to facilitate fetal rotation and descent. Will reassess with cervical exam at 1200 or earlier if necessary PreE: Monitor BP labetalol protocol as indicated.  Stop pitocin for 1 hour, restart at 2x2 and titrate to adequate cxt pattern or fetal tolerance to max of 10 until cervix ripe and foley out then continue to increase 2x2.  Anticipate labor progression and vaginal delivery.   Md Humnoke aware of plan and verbalized agreement.   Noralyn Pick, NP-C, CNM, MSN 10/22/2020. 1:07 PM

## 2020-10-22 NOTE — Progress Notes (Signed)
CNM asked to confirm presentation. Pt informed that the ultrasound is considered a limited OB ultrasound and is not intended to be a complete ultrasound exam.  Patient also informed that the ultrasound is not being completed with the intent of assessing for fetal or placental anomalies or any pelvic abnormalities.  Explained that the purpose of today's ultrasound is to assess for  presentation.  Patient acknowledges the purpose of the exam and the limitations of the study.    Vertex presentation confirmed.   Wende Mott, CNM 10/22/20 10:08 AM

## 2020-10-23 ENCOUNTER — Encounter (HOSPITAL_COMMUNITY): Payer: Self-pay | Admitting: Obstetrics and Gynecology

## 2020-10-23 DIAGNOSIS — Z3490 Encounter for supervision of normal pregnancy, unspecified, unspecified trimester: Secondary | ICD-10-CM | POA: Diagnosis not present

## 2020-10-23 DIAGNOSIS — D62 Acute posthemorrhagic anemia: Secondary | ICD-10-CM | POA: Diagnosis not present

## 2020-10-23 LAB — CBC
HCT: 30.3 % — ABNORMAL LOW (ref 36.0–46.0)
Hemoglobin: 9.5 g/dL — ABNORMAL LOW (ref 12.0–15.0)
MCH: 27.4 pg (ref 26.0–34.0)
MCHC: 31.4 g/dL (ref 30.0–36.0)
MCV: 87.3 fL (ref 80.0–100.0)
Platelets: 319 10*3/uL (ref 150–400)
RBC: 3.47 MIL/uL — ABNORMAL LOW (ref 3.87–5.11)
RDW: 14.5 % (ref 11.5–15.5)
WBC: 12.8 10*3/uL — ABNORMAL HIGH (ref 4.0–10.5)
nRBC: 0 % (ref 0.0–0.2)

## 2020-10-23 MED ORDER — MAGNESIUM OXIDE 400 (241.3 MG) MG PO TABS
400.0000 mg | ORAL_TABLET | Freq: Every day | ORAL | Status: DC
  Administered 2020-10-24: 11:00:00 400 mg via ORAL
  Filled 2020-10-23: qty 1

## 2020-10-23 MED ORDER — POLYSACCHARIDE IRON COMPLEX 150 MG PO CAPS
150.0000 mg | ORAL_CAPSULE | Freq: Every day | ORAL | Status: DC
  Administered 2020-10-24: 11:00:00 150 mg via ORAL
  Filled 2020-10-23: qty 1

## 2020-10-23 NOTE — Progress Notes (Signed)
Subjective: Postpartum Day # 1 : S/P NSVD due to IOL for PreE without SF PCR 12/8 PCR was 0.62. Patient up ad lib, denies syncope or dizziness. Reports consuming regular diet without issues and denies N/V. Patient reports 0 bowel movement + passing flatus.  Denies issues with urination and reports bleeding is "lighter."  Patient is breastfeeding and reports going well.  Desires undecided for postpartum contraception.  Pain is being appropriately managed with use of po meds.   No laceration Feeding:  Breast Contraceptive plan:  Undecided BB: Circ in pt desired prior to d/c.   Objective: Vital signs in last 24 hours: Patient Vitals for the past 24 hrs:  BP Temp Temp src Pulse Resp SpO2  10/23/20 0400 -- 98.5 F (36.9 C) Oral 80 18 100 %  10/23/20 0000 137/81 97.8 F (36.6 C) Oral 80 18 100 %  10/22/20 2300 (!) 150/85 98.2 F (36.8 C) -- 82 18 100 %  10/22/20 2215 (!) 146/76 -- -- 81 18 --  10/22/20 2205 138/74 -- -- 89 18 --  10/22/20 2145 (!) 145/77 -- -- 89 18 --  10/22/20 2130 (!) 144/73 -- -- 96 -- --  10/22/20 2120 (!) 156/79 -- -- 93 -- --  10/22/20 2100 (!) 148/93 -- -- -- 16 --  10/22/20 1930 140/75 -- -- 74 17 --  10/22/20 1907 135/83 -- -- 75 18 --  10/22/20 1900 -- 98.1 F (36.7 C) Oral -- -- --  10/22/20 1702 (!) 148/71 -- -- 79 -- --  10/22/20 1700 -- -- -- -- 17 --  10/22/20 1632 (!) 142/72 -- -- 81 -- --  10/22/20 1602 (!) 144/79 -- -- 83 -- --  10/22/20 1545 -- -- -- -- 18 --  10/22/20 1532 (!) 154/94 -- -- 81 -- --  10/22/20 1502 (!) 145/87 -- -- 83 -- --  10/22/20 1444 (!) 154/95 -- -- 79 -- --  10/22/20 1402 (!) 115/59 -- -- 82 -- --  10/22/20 1332 113/60 -- -- 84 -- --  10/22/20 1302 119/63 -- -- 90 -- --  10/22/20 1246 125/70 -- -- 86 -- --  10/22/20 1201 126/62 -- -- 91 -- --  10/22/20 1131 112/75 -- -- 80 -- --  10/22/20 1102 116/77 -- -- 84 16 --  10/22/20 1032 121/64 -- -- 82 -- --  10/22/20 1012 122/80 -- -- 94 -- --  10/22/20 0902 (!) 150/101 --  -- 98 -- --  10/22/20 0817 (!) 145/88 -- -- 83 -- --     Physical Exam:  General: alert, cooperative, appears stated age and no distress Mood/Affect: Happy Lungs: clear to auscultation, no wheezes, rales or rhonchi, symmetric air entry.  Heart: normal rate, regular rhythm, normal S1, S2, no murmurs, rubs, clicks or gallops. Breast: breasts appear normal, no suspicious masses, no skin or nipple changes or axillary nodes. Abdomen:  + bowel sounds, soft, non-tender GU: perineum Intact, healing well. No signs of external hematomas.  Uterine Fundus: firm Lochia: appropriate Skin: Warm, Dry. DVT Evaluation: No evidence of DVT seen on physical exam. Negative Homan's sign. No cords or calf tenderness. No significant calf/ankle edema.  CBC Latest Ref Rng & Units 10/23/2020 10/21/2020 10/15/2020  WBC 4.0 - 10.5 K/uL 12.8(H) 7.3 9.1  Hemoglobin 12.0 - 15.0 g/dL 9.5(L) 10.0(L) 9.8(L)  Hematocrit 36.0 - 46.0 % 30.3(L) 31.3(L) 29.9(L)  Platelets 150 - 400 K/uL 319 353 334    Results for orders placed or performed during the hospital encounter  of 10/21/20 (from the past 24 hour(s))  CBC     Status: Abnormal   Collection Time: 10/23/20  4:04 AM  Result Value Ref Range   WBC 12.8 (H) 4.0 - 10.5 K/uL   RBC 3.47 (L) 3.87 - 5.11 MIL/uL   Hemoglobin 9.5 (L) 12.0 - 15.0 g/dL   HCT 30.3 (L) 36.0 - 46.0 %   MCV 87.3 80.0 - 100.0 fL   MCH 27.4 26.0 - 34.0 pg   MCHC 31.4 30.0 - 36.0 g/dL   RDW 14.5 11.5 - 15.5 %   Platelets 319 150 - 400 K/uL   nRBC 0.0 0.0 - 0.2 %     CBG (last 3)  No results for input(s): GLUCAP in the last 72 hours.   I/O last 3 completed shifts: In: 295.2 [I.V.:295.2] Out: 342 [Blood:511]   Assessment Postpartum Day # 1 : S/P NSVD due to IOL for PreE without SF, PCR was 0.62 on 12/8, asymptomatic, BP now 135/91, stable. Pt stable. -1 involution. breastfeeding. Hemodynamically stable with hgb drop of 10-9.5 with EBL of 527mls over an intact perinum, asymptomatic.    Plan: Continue other mgmt as ordered PreE: monitor BP PP Anemia: Start PO Iron today.  VTE prophylactics: Early ambulated as tolerates.  Pain control: Motrin/Tylenol PRN Education given regarding options for contraception, including barrier methods, injectable contraception, IUD placement, oral contraceptives.  Plan for discharge tomorrow, Breastfeeding, Lactation consult and Circumcision prior to discharge   Dr. Landry Mellow to be updated on patient status  [facility] NP-C, CNM 10/23/2020, 7:37 AM

## 2020-10-24 MED ORDER — IBUPROFEN 600 MG PO TABS: 600.0000 mg | ORAL_TABLET | Freq: Four times a day (QID) | ORAL | 0 refills | Status: AC

## 2020-10-24 MED ORDER — NIFEDIPINE ER 30 MG PO TB24: 30.0000 mg | ORAL_TABLET | Freq: Every day | ORAL | 2 refills | Status: AC

## 2020-10-24 MED ORDER — ACETAMINOPHEN 325 MG PO TABS: 1000.0000 mg | ORAL_TABLET | Freq: Three times a day (TID) | ORAL | Status: AC | PRN

## 2020-10-24 MED ORDER — NIFEDIPINE ER OSMOTIC RELEASE 30 MG PO TB24
30.0000 mg | ORAL_TABLET | Freq: Every day | ORAL | Status: DC
  Administered 2020-10-24: 06:00:00 30 mg via ORAL
  Filled 2020-10-24: qty 1

## 2020-10-24 NOTE — Discharge Instructions (Signed)

## 2020-10-24 NOTE — Procedures (Signed)
Circumcision Note Consent obtained from parent. Time out done Penis cleaned with Betadine 1cc 1% lidocaine used for dorsal block Mogen used to do circumcision Hemostasis noted.   No complications.

## 2020-10-24 NOTE — Discharge Summary (Signed)
Postpartum Discharge Summary  Date of Service updated 10/24/20    Patient Name: Lydia Mendoza DOB: 07-12-1986 MRN: [id number]  Date of admission: 10/21/2020 Delivery date:10/22/2020  Delivering provider: Noralyn Pick  Date of discharge: 10/24/2020  Admitting diagnosis: Pregnant [Z34.90] Intrauterine pregnancy: [redacted]w[redacted]d    Secondary diagnosis:  Active Problems:   Pregnant   Acute blood loss anemia   Normal postpartum course   Encounter for induction of labor   SVD (spontaneous vaginal delivery)  Additional problems: Pregnancy induced hypertension   Discharge diagnosis: Term Pregnancy Delivered and Gestational Hypertension                                              Post partum procedures:none Augmentation: Pitocin and Cytotec Complications: None  Hospital course: Induction of Labor With Vaginal Delivery   34y.o. yo [id number] admitted to the hospital 10/21/2020 for induction of labor.  Indication for induction: Gestational hypertension.  Patient had an uncomplicated labor course as follows: Membrane Rupture Time/Date: 12:39 PM ,10/22/2020   Delivery Method:Vaginal, Spontaneous  Episiotomy: None  Lacerations:  None  Details of delivery can be found in separate delivery note.  Patient had a routine postpartum course. Patient is discharged home 10/24/20.  Newborn Data: Birth date:10/22/2020  Birth time:8:48 PM  Gender:Female  Living status:Living  Apgars:8 ,9  Weight:2940 g   Magnesium Sulfate received: No BMZ received: Yes Rhophylac:N/A MMR:N/A T-DaP:declined Flu: No Transfusion:No  Physical exam  Vitals:   10/23/20 1230 10/23/20 2000 10/24/20 0530 10/24/20 1005  BP: 135/80 (!) 138/91 (!) 146/94 140/78  Pulse: 75 89 77 88  Resp: _0 Temp: 98.3 F (36.8 C) 98.3 F (36.8 C) 98.4 F (36.9 C) 97.6 F (36.4 C)  TempSrc: Oral Oral Oral Oral  SpO2:  100% 100% 100%  Weight:      Height:       General: alert, cooperative and no  distress Lochia: appropriate Uterine Fundus: firm Incision: N/A DVT Evaluation: No evidence of DVT seen on physical exam. No cords or calf tenderness. No significant calf/ankle edema. Labs: Lab Results  Component Value Date   WBC 12.8 (H) 10/23/2020   HGB 9.5 (L) 10/23/2020   HCT 30.3 (L) 10/23/2020   MCV 87.3 10/23/2020   PLT 319 10/23/2020   CMP Latest Ref Rng & Units 10/21/2020  Glucose 70 - 99 mg/dL 93  BUN 6 - 20 mg/dL 6  Creatinine 0.44 - 1.00 mg/dL 0.69  Sodium 135 - 145 mmol/L 136  Potassium 3.5 - 5.1 mmol/L 3.9  Chloride 98 - 111 mmol/L 103  CO2 22 - 32 mmol/L 23  Calcium 8.9 - 10.3 mg/dL 9.1  Total Protein 6.5 - 8.1 g/dL 6.7  Total Bilirubin 0.3 - 1.2 mg/dL 0.5  Alkaline Phos 38 - 126 U/L 125  AST 15 - 41 U/L 19  ALT 0 - 44 U/L 14   Edinburgh Score: Edinburgh Postnatal Depression Scale Screening Tool 10/23/2020  I have been able to laugh and see the funny side of things. 0  I have looked forward with enjoyment to things. 0  I have blamed myself unnecessarily when things went wrong. 1  I have been anxious or worried for no good reason. 2  I have felt scared or panicky for no good reason. 1  Things have been getting on top of  me. 1  I have been so unhappy that I have had difficulty sleeping. 1  I have felt sad or miserable. 1  I have been so unhappy that I have been crying. 1  The thought of harming myself has occurred to me. 0  Edinburgh Postnatal Depression Scale Total 8      After visit meds:  Allergies as of 10/24/2020      Reactions   Shellfish Allergy Hives, Itching      Medication List    STOP taking these medications   aspirin 81 MG chewable tablet     TAKE these medications   acetaminophen 325 MG tablet Commonly known as: Tylenol Take 3 tablets (975 mg total) by mouth every 8 (eight) hours as needed (for pain scale < 4).   ferrous sulfate 325 (65 FE) MG tablet Take 325 mg by mouth daily with breakfast.   ibuprofen 600 MG  tablet Commonly known as: ADVIL Take 1 tablet (600 mg total) by mouth every 6 (six) hours.   NIFEdipine 30 MG 24 hr tablet Commonly known as: ADALAT CC Take 1 tablet (30 mg total) by mouth daily. Start taking on: October 25, 2020   Prenatal Vitamin 27-0.8 MG Tabs Take 1 tablet by mouth daily.        Discharge home in stable condition Infant Feeding: Bottle Infant Disposition:home with mother Discharge instruction: per After Visit Summary and Postpartum booklet. Activity: Advance as tolerated. Pelvic rest for 6 weeks.  Diet: low salt diet Anticipated Birth Control: OCPs Postpartum Appointment:6 weeks Additional Postpartum F/U: BP check 2-3 days Future Appointments:No future appointments. Follow up Visit:  [facility]. Schedule an appointment as soon as possible for a visit in 3 day(s).   Specialty: Obstetrics and Gynecology Why: Call office today for an appointment to have your blood pressure checked on Thursday, 10/27/20. Keep scheduled appointment for your 6-week postpartum visit.  Contact information: [address] [place] [address] [phone]                  10/24/2020 [name], CNM

## 2021-02-16 IMAGING — CT CT HEAD W/O CM
4 series · 17 of 47 positions shown, 19 images · non-contrast
Comparison: Report from head CT 05/13/2004 (images unavailable).

CLINICAL DATA: Syncope, recurrent.

EXAM:
CT HEAD WITHOUT CONTRAST
TECHNIQUE: Contiguous axial images were obtained from the base of the skull
through the vertex without intravenous contrast.

[Series 3: head without · axial · non-contrast · 0.46mm/px · z∈[-100,+20]mm · 7 of 33 slices shown, 9 images]
[im 5/33  brain]
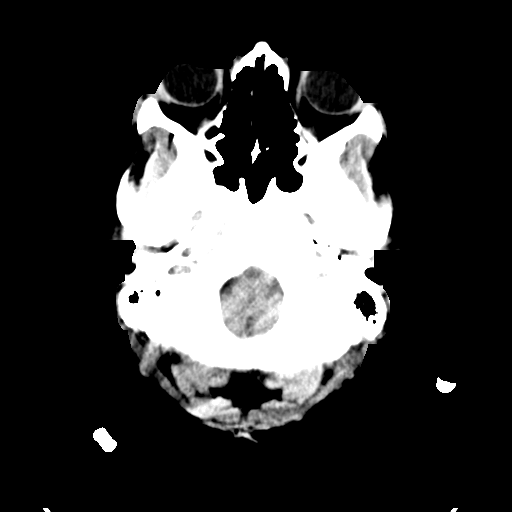
[im 5/33  bone]
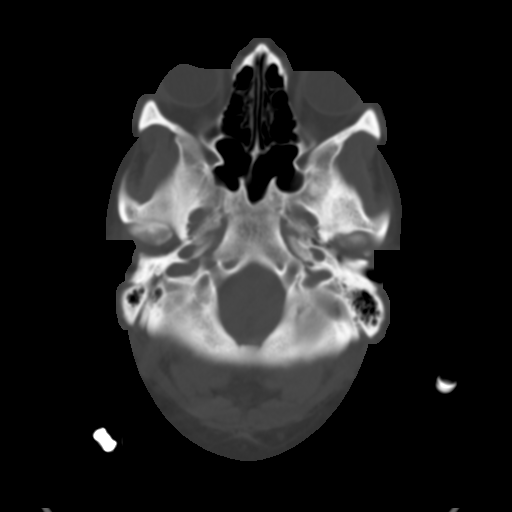
[im 9/33  brain]
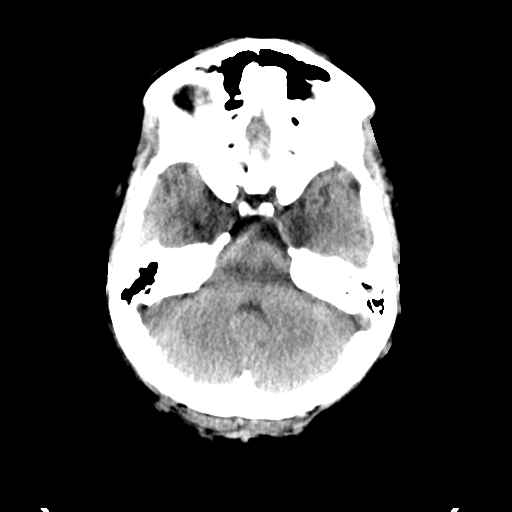
[im 13/33  brain]
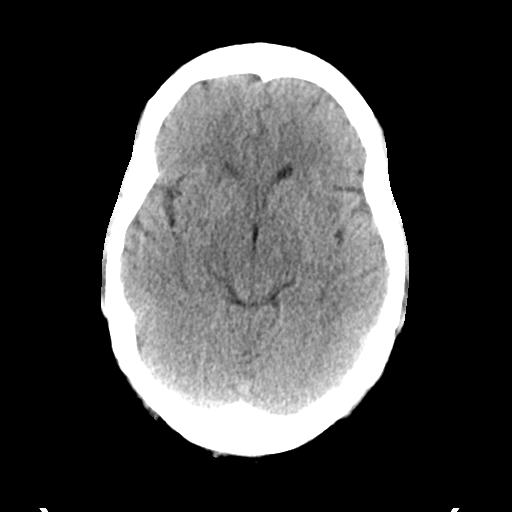
[im 17/33  brain]
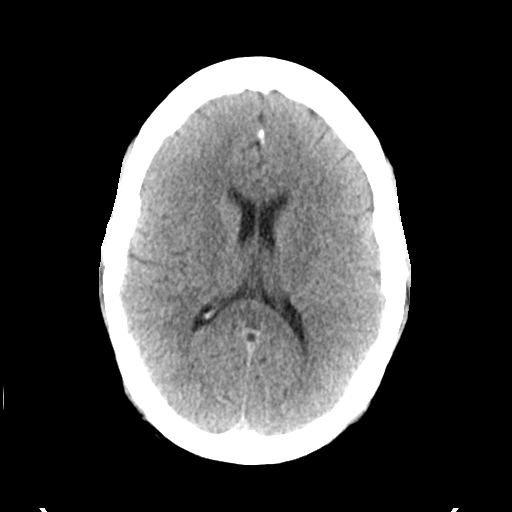
[im 21/33  brain]
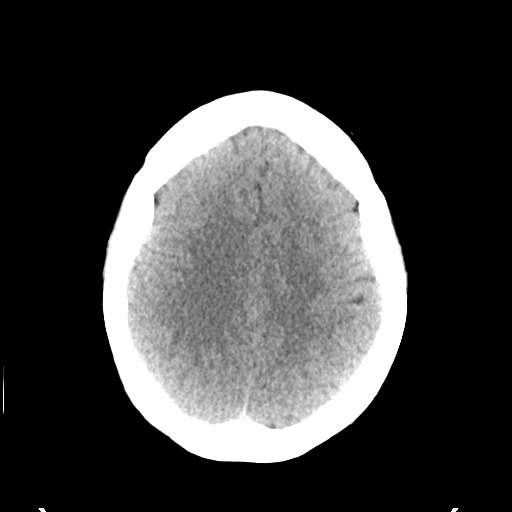
[im 21/33  bone]
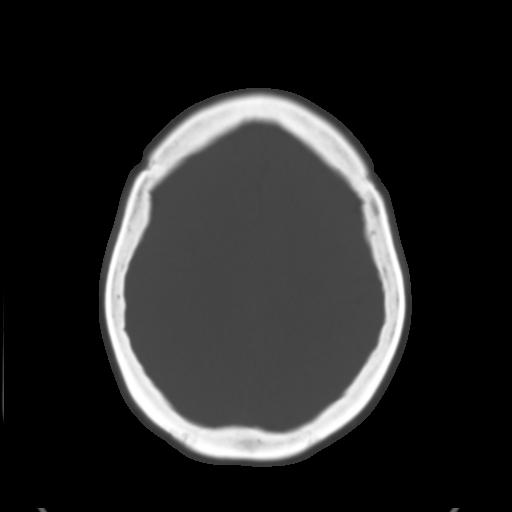
[im 25/33  brain]
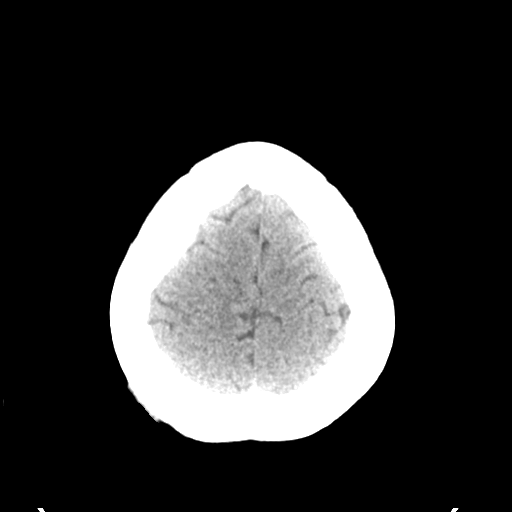
[im 29/33  brain]
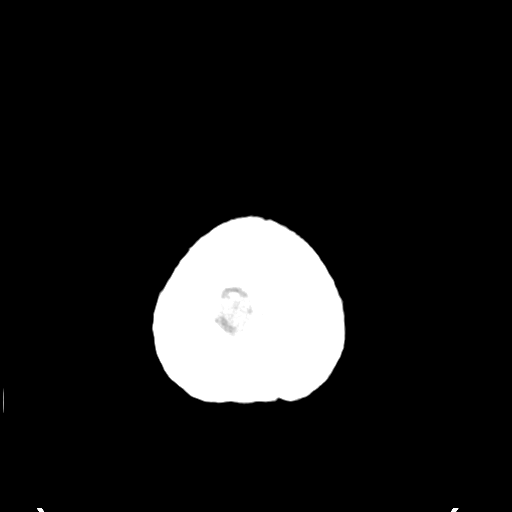

[Series 4: head bone · axial · 0.46mm/px · z∈[-104,-48]mm · 4 of 82 slices shown]
[im 9/82  bone]
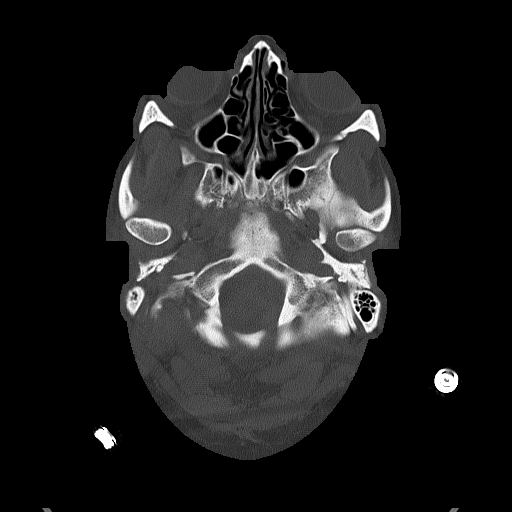
[im 17/82  bone]
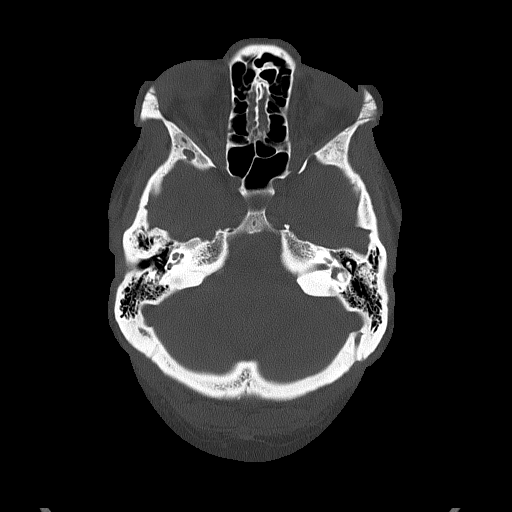
[im 25/82  bone]
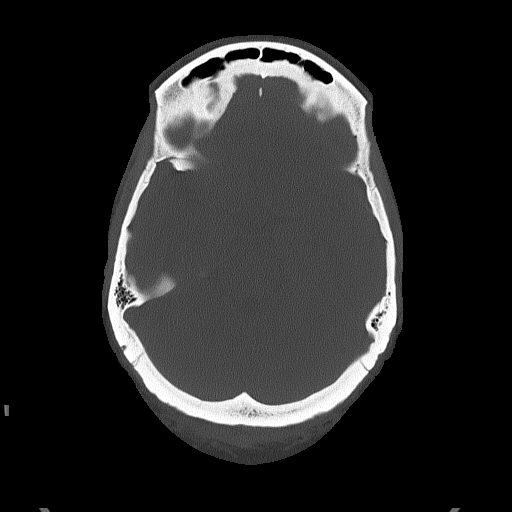
[im 37/82  bone]
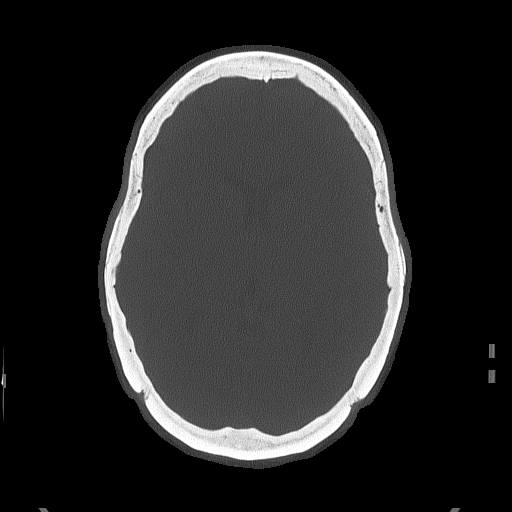

[Series 5: head without cor · coronal · non-contrast · 0.32mm/px · 3 of 71 slices shown]
[im 24/71  brain]
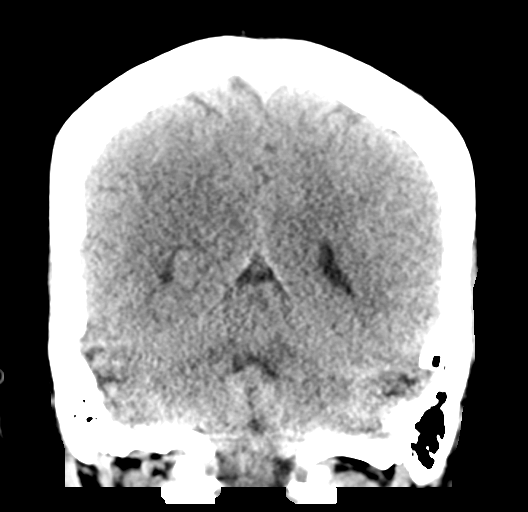
[im 32/71  brain]
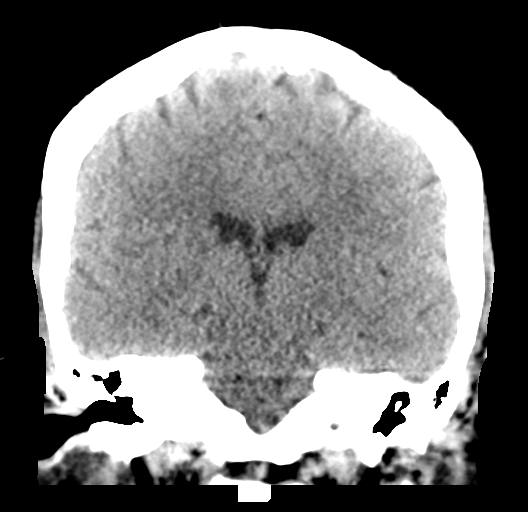
[im 39/71  brain]
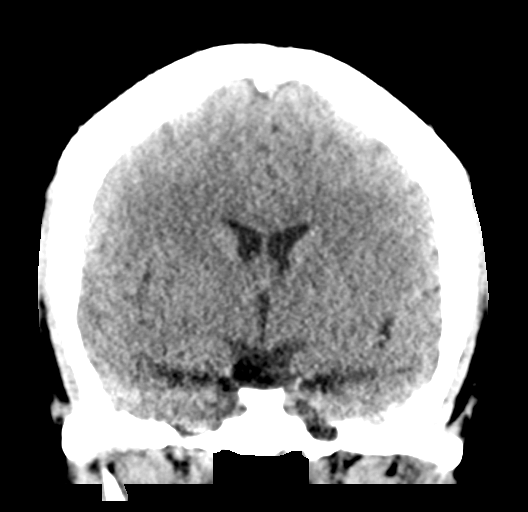

[Series 6: head without sag · sagittal · non-contrast · 0.33mm/px · 3 of 52 slices shown]
[im 18/52  brain]
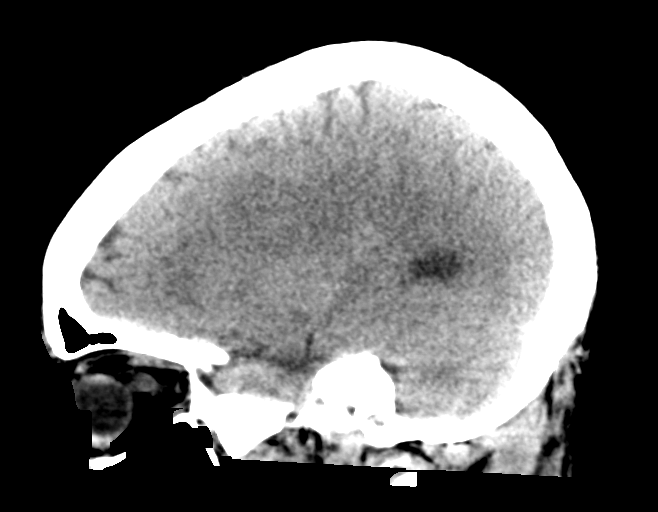
[im 26/52  brain]
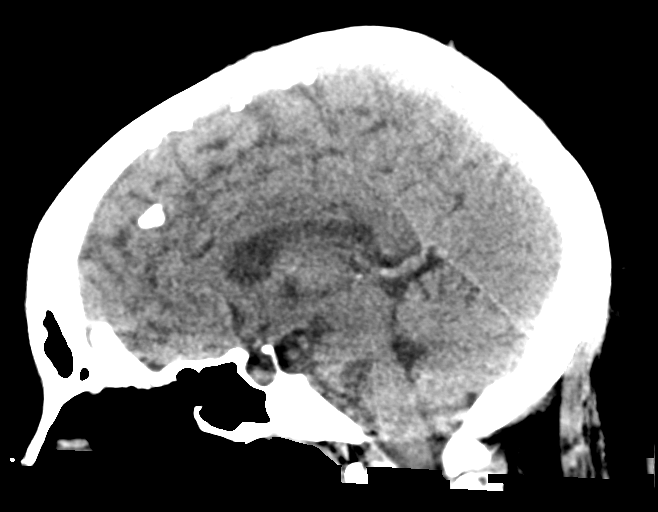
[im 35/52  brain]
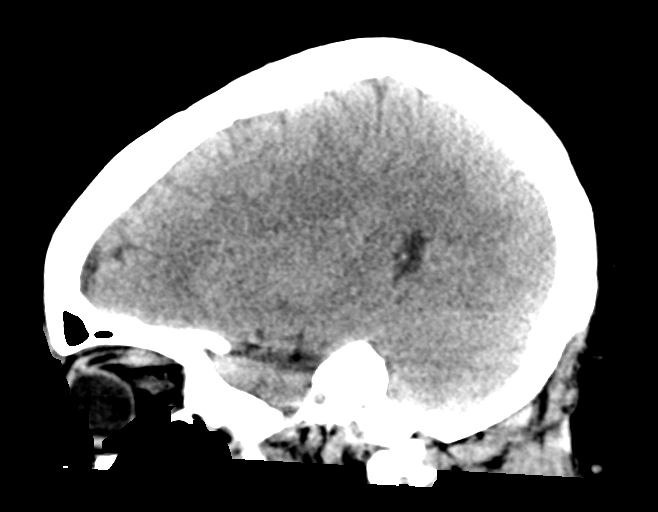

[17 of 47 positions shown; findings below may reference images not displayed]

FINDINGS: Brain:

Cerebral volume is normal.

There is no acute intracranial hemorrhage.

No demarcated cortical infarct.

No extra-axial fluid collection.

No evidence of intracranial mass.

No midline shift.

Vascular: No hyperdense vessel.

Skull: Normal. Negative for fracture or focal lesion.

Sinuses/Orbits: Visualized orbits show no acute finding. No
significant paranasal sinus disease at the imaged levels.

Other: Right periorbital soft tissue swelling/hematoma.
IMPRESSION: Unremarkable non-contrast CT appearance of the brain. No evidence of
acute intracranial abnormality.

Right periorbital soft tissue swelling/hematoma.
